# Patient Record
Sex: Female | Born: 1935
Health system: Southern US, Community
[De-identification: ages and names within clinical notes are randomized; demographics above are authoritative.]

## PROBLEM LIST (undated history)

## (undated) DIAGNOSIS — I10 Essential (primary) hypertension: Secondary | ICD-10-CM

## (undated) DIAGNOSIS — I4891 Unspecified atrial fibrillation: Secondary | ICD-10-CM

## (undated) DIAGNOSIS — R002 Palpitations: Secondary | ICD-10-CM

## (undated) DIAGNOSIS — C50919 Malignant neoplasm of unspecified site of unspecified female breast: Secondary | ICD-10-CM

## (undated) DIAGNOSIS — E785 Hyperlipidemia, unspecified: Secondary | ICD-10-CM

## (undated) DIAGNOSIS — I491 Atrial premature depolarization: Secondary | ICD-10-CM

## (undated) DIAGNOSIS — K529 Noninfective gastroenteritis and colitis, unspecified: Secondary | ICD-10-CM

## (undated) DIAGNOSIS — M1990 Unspecified osteoarthritis, unspecified site: Secondary | ICD-10-CM

## (undated) DIAGNOSIS — N6320 Unspecified lump in the left breast, unspecified quadrant: Principal | ICD-10-CM

## (undated) HISTORY — PX: CARDIAC CATHETERIZATION: SHX172

## (undated) HISTORY — DX: Hyperlipidemia, unspecified: E78.5

## (undated) HISTORY — DX: Essential (primary) hypertension: I10

## (undated) HISTORY — PX: TONSILLECTOMY: SUR1361

## (undated) HISTORY — PX: APPENDECTOMY: SHX54

## (undated) HISTORY — DX: Palpitations: R00.2

## (undated) HISTORY — DX: Atrial premature depolarization: I49.1

## (undated) HISTORY — DX: Malignant neoplasm of unspecified site of unspecified female breast: C50.919

## (undated) HISTORY — PX: TUMOR REMOVAL: SHX12

## (undated) HISTORY — PX: COLONOSCOPY: SHX174

## (undated) HISTORY — PX: ABDOMINAL HYSTERECTOMY: SHX81

## (undated) HISTORY — DX: Unspecified lump in the left breast, unspecified quadrant: N63.20

---

## 1997-04-19 HISTORY — PX: MASTECTOMY PARTIAL / LUMPECTOMY: SUR851

## 1997-08-06 ENCOUNTER — Other Ambulatory Visit: Admission: RE | Admit: 1997-08-06 | Discharge: 1997-08-06 | Payer: Self-pay | Admitting: Gastroenterology

## 1998-03-07 ENCOUNTER — Ambulatory Visit (HOSPITAL_COMMUNITY): Admission: RE | Admit: 1998-03-07 | Discharge: 1998-03-07 | Payer: Self-pay

## 1998-03-08 HISTORY — PX: BREAST EXCISIONAL BIOPSY: SUR124

## 1998-03-08 HISTORY — PX: BREAST LUMPECTOMY: SHX2

## 1998-03-12 ENCOUNTER — Encounter: Admission: RE | Admit: 1998-03-12 | Discharge: 1998-06-10 | Payer: Self-pay | Admitting: Radiation Oncology

## 1998-04-01 ENCOUNTER — Ambulatory Visit (HOSPITAL_COMMUNITY): Admission: RE | Admit: 1998-04-01 | Discharge: 1998-04-02 | Payer: Self-pay

## 1998-06-11 ENCOUNTER — Encounter: Admission: RE | Admit: 1998-06-11 | Discharge: 1998-09-09 | Payer: Self-pay | Admitting: Radiation Oncology

## 1998-06-26 ENCOUNTER — Ambulatory Visit (HOSPITAL_COMMUNITY): Admission: RE | Admit: 1998-06-26 | Discharge: 1998-06-26 | Payer: Self-pay | Admitting: General Surgery

## 1998-07-06 ENCOUNTER — Emergency Department (HOSPITAL_COMMUNITY): Admission: EM | Admit: 1998-07-06 | Discharge: 1998-07-06 | Payer: Self-pay | Admitting: Emergency Medicine

## 1999-04-17 ENCOUNTER — Encounter: Payer: Self-pay | Admitting: Oncology

## 1999-04-17 ENCOUNTER — Encounter: Admission: RE | Admit: 1999-04-17 | Discharge: 1999-04-17 | Payer: Self-pay | Admitting: Oncology

## 1999-10-19 ENCOUNTER — Encounter: Admission: RE | Admit: 1999-10-19 | Discharge: 1999-10-19 | Payer: Self-pay

## 2000-03-08 ENCOUNTER — Encounter: Admission: RE | Admit: 2000-03-08 | Discharge: 2000-03-08 | Payer: Self-pay | Admitting: Internal Medicine

## 2000-03-08 ENCOUNTER — Encounter: Payer: Self-pay | Admitting: Internal Medicine

## 2000-04-20 ENCOUNTER — Encounter: Admission: RE | Admit: 2000-04-20 | Discharge: 2000-04-20 | Payer: Self-pay

## 2000-06-22 ENCOUNTER — Ambulatory Visit (HOSPITAL_COMMUNITY): Admission: RE | Admit: 2000-06-22 | Discharge: 2000-06-22 | Payer: Self-pay | Admitting: Gastroenterology

## 2001-02-21 ENCOUNTER — Encounter: Admission: RE | Admit: 2001-02-21 | Discharge: 2001-02-21 | Payer: Self-pay | Admitting: *Deleted

## 2001-04-25 ENCOUNTER — Encounter: Admission: RE | Admit: 2001-04-25 | Discharge: 2001-04-25 | Payer: Self-pay | Admitting: Surgery

## 2001-04-25 ENCOUNTER — Encounter: Payer: Self-pay | Admitting: Surgery

## 2001-11-17 ENCOUNTER — Encounter: Payer: Self-pay | Admitting: Internal Medicine

## 2001-11-17 ENCOUNTER — Encounter: Admission: RE | Admit: 2001-11-17 | Discharge: 2001-11-17 | Payer: Self-pay | Admitting: Internal Medicine

## 2002-04-25 ENCOUNTER — Encounter: Admission: RE | Admit: 2002-04-25 | Discharge: 2002-04-25 | Payer: Self-pay | Admitting: Surgery

## 2002-04-25 ENCOUNTER — Encounter: Payer: Self-pay | Admitting: Surgery

## 2003-04-29 ENCOUNTER — Encounter: Admission: RE | Admit: 2003-04-29 | Discharge: 2003-04-29 | Payer: Self-pay | Admitting: Surgery

## 2004-01-06 ENCOUNTER — Encounter: Admission: RE | Admit: 2004-01-06 | Discharge: 2004-01-06 | Payer: Self-pay | Admitting: Surgery

## 2004-05-04 ENCOUNTER — Encounter: Admission: RE | Admit: 2004-05-04 | Discharge: 2004-05-04 | Payer: Self-pay | Admitting: Surgery

## 2004-05-25 ENCOUNTER — Ambulatory Visit: Payer: Self-pay | Admitting: Internal Medicine

## 2004-06-01 ENCOUNTER — Ambulatory Visit: Payer: Self-pay | Admitting: Internal Medicine

## 2004-10-13 ENCOUNTER — Ambulatory Visit (HOSPITAL_COMMUNITY): Admission: RE | Admit: 2004-10-13 | Discharge: 2004-10-13 | Payer: Self-pay | Admitting: Internal Medicine

## 2004-10-13 ENCOUNTER — Ambulatory Visit: Payer: Self-pay | Admitting: Internal Medicine

## 2005-01-15 ENCOUNTER — Ambulatory Visit: Payer: Self-pay | Admitting: Oncology

## 2005-05-10 ENCOUNTER — Encounter: Admission: RE | Admit: 2005-05-10 | Discharge: 2005-05-10 | Payer: Self-pay | Admitting: Surgery

## 2005-08-18 ENCOUNTER — Encounter: Payer: Self-pay | Admitting: Radiation Oncology

## 2006-01-27 ENCOUNTER — Ambulatory Visit: Payer: Self-pay | Admitting: Oncology

## 2006-05-11 ENCOUNTER — Encounter: Admission: RE | Admit: 2006-05-11 | Discharge: 2006-05-11 | Payer: Self-pay | Admitting: Surgery

## 2007-01-27 ENCOUNTER — Ambulatory Visit: Payer: Self-pay | Admitting: Oncology

## 2007-01-31 LAB — CBC WITH DIFFERENTIAL/PLATELET
BASO%: 0.7 % (ref 0.0–2.0)
EOS%: 3 % (ref 0.0–7.0)
LYMPH%: 31.5 % (ref 14.0–48.0)
MCHC: 35.4 g/dL (ref 32.0–36.0)
MCV: 89.3 fL (ref 81.0–101.0)
MONO%: 10.4 % (ref 0.0–13.0)
Platelets: 180 10*3/uL (ref 145–400)
RBC: 4.18 10*6/uL (ref 3.70–5.32)
WBC: 5.8 10*3/uL (ref 3.9–10.0)

## 2007-01-31 LAB — COMPREHENSIVE METABOLIC PANEL
ALT: 10 U/L (ref 0–35)
Alkaline Phosphatase: 60 U/L (ref 39–117)
Sodium: 140 mEq/L (ref 135–145)
Total Bilirubin: 0.5 mg/dL (ref 0.3–1.2)
Total Protein: 6 g/dL (ref 6.0–8.3)

## 2007-05-15 ENCOUNTER — Encounter: Admission: RE | Admit: 2007-05-15 | Discharge: 2007-05-15 | Payer: Self-pay | Admitting: Internal Medicine

## 2008-05-27 ENCOUNTER — Encounter: Admission: RE | Admit: 2008-05-27 | Discharge: 2008-05-27 | Payer: Self-pay | Admitting: Internal Medicine

## 2009-05-27 ENCOUNTER — Encounter: Admission: RE | Admit: 2009-05-27 | Discharge: 2009-05-27 | Payer: Self-pay | Admitting: Internal Medicine

## 2009-10-01 ENCOUNTER — Ambulatory Visit: Payer: Self-pay | Admitting: Vascular Surgery

## 2010-03-09 ENCOUNTER — Ambulatory Visit: Payer: Self-pay | Admitting: Cardiology

## 2010-06-10 ENCOUNTER — Other Ambulatory Visit: Payer: Self-pay | Admitting: Internal Medicine

## 2010-06-10 DIAGNOSIS — Z1231 Encounter for screening mammogram for malignant neoplasm of breast: Secondary | ICD-10-CM

## 2010-07-02 ENCOUNTER — Ambulatory Visit
Admission: RE | Admit: 2010-07-02 | Discharge: 2010-07-02 | Disposition: A | Discharge status: Home / Self Care | Payer: Medicare Other | Source: Ambulatory Visit | Attending: Internal Medicine | Admitting: Internal Medicine

## 2010-07-02 DIAGNOSIS — Z1231 Encounter for screening mammogram for malignant neoplasm of breast: Secondary | ICD-10-CM

## 2010-07-14 ENCOUNTER — Encounter: Payer: Self-pay | Admitting: Internal Medicine

## 2010-08-18 ENCOUNTER — Other Ambulatory Visit: Payer: Self-pay | Admitting: Cardiology

## 2010-08-18 ENCOUNTER — Other Ambulatory Visit: Payer: Self-pay | Admitting: *Deleted

## 2010-08-18 NOTE — Telephone Encounter (Signed)
Medication refill

## 2010-09-01 NOTE — Consult Note (Signed)
NEW PATIENT CONSULTATION   Cassandra Lang, Cassandra Lang  DOB:  Aug 08, 1924                                       10/01/2009  GNFAO#:[id number]   The patient presents today for evaluation of lower extremity swelling  and pain.  She is an active 77 year old white female who reports  progressive pain, swelling and stinging sensation in her legs  bilaterally.  This is most pronounced in her mid calves distally.  She  reports this has been progressive over the past several months.  She  does not have any specific relief with elevation or compression although  she does wear support hose occasionally if she is going to be standing  for a great deal of time.  She reports this does make it somewhat worse.   Her past history is negative for DVT.  She does have history of  hypertension, elevated cholesterol.   Her family history is significant for brother dying of heart valve  disease at age 38.   SOCIAL HISTORY:  She is married with one child.  She is retired.  She  does not smoke or drink alcohol.   REVIEW OF SYSTEMS:  Review of systems is noted in her chart.  She has no  weight loss or weight gain.  She is 156 pounds.  She is 5 feet 3 inches  tall.  She does have cardiac palpitations.  PULMONARY:  Negative.  GI:  Occasional diarrhea.  GU:  Negative.  VASCULAR:  She was noted to have phlebitis around the time of a ruptured  appendix since 1948 but no problems since that time.  NEUROLOGIC:  Negative.  MUSCULOSKELETAL:  Positive for arthritic and back pain.  Psychiatric, ENT, hematologic, skin are all negative.   PHYSICAL EXAMINATION:  General:  Well-developed, well-nourished white  female appearing stated age.  Vital signs:  Blood pressure is 148/60,  pulse 64, respirations 16.  She is in no acute stress.  HEENT:  Normal.  Abdomen:  Her abdomen is soft and nontender.  She does have 2+ radial,  2+ posterior tibial pulses and 2+ popliteal pulses bilaterally.  Musculoskeletal:   Negative for major deformities or cyanosis.  Neurological:  No focal weakness or paresthesias.  Skin:  She does have  pitting edema on both lower extremities from her below knee down towards  her ankle.  She has erythema over her pretibial area towards her ankle  bilaterally.   I ordered, reviewed and interpreted noninvasive lower extremity studies  on the patient that demonstrate no evidence of DVT or superficial  thrombus.  She has no evidence of reflux in her deep or superficial  system.  I discussed this at length with the patient.  I explained that  although her physical exam is somewhat characteristic of venous  hypertension her Doppler study shows that this is not the case.  She  does have normal arterial flow.  She was reassured with this discussion  and will follow up with Dr. Clelia Croft and Dr. Terri Piedra regarding the swelling  and erythema.     Larina Earthly, M.D.  Electronically Signed   TFE/MEDQ  D:  10/01/2009  T:  10/02/2009  Job:  [id number]   cc:   Kari Baars, M.D.  [name]. [place], M.D.  Frederick A. Worthy Rancher, M.D.

## 2010-09-01 NOTE — Procedures (Signed)
DUPLEX DEEP VENOUS EXAM - LOWER EXTREMITY   INDICATION:  Pain and swelling.   HISTORY:  Edema:  Yes.  Trauma/Surgery:  No.  Pain:  Yes.  PE:  No.  Previous DVT:  No.  Anticoagulants:  No.  Other:   DUPLEX EXAM:                CFV   SFV   PopV  PTV    GSV                R  L  R  L  R  L  R   L  R  L  Thrombosis    0  0  0  0  0  0  0   0  0  0  Spontaneous   +  +  +  +  +  +  +   +  +  +  Phasic        +  +  +  +  +  +  +   +  +  +  Augmentation  +  +  +  +  +  +  +   +  +  +  Compressible  +  +  +  +  +  +  +   +  +  +  Competent     +  +  +  +  +  +  +   +  +  +   Legend:  + - yes  o - no  p - partial  D - decreased   IMPRESSION:  Bilateral legs are negative for deep venous thrombosis  without evidence of deep venous reflux or  superficial venous reflux.    _____________________________  Larina Earthly, M.D.   NT/MEDQ  D:  10/01/2009  T:  10/01/2009  Job:  [id number]

## 2010-09-04 NOTE — Procedures (Signed)
[facility]  Patient:    Cassandra Lang, FERNICOLA                          MRN: [id number] Proc. Date: 06/22/00 Adm. Date:  [id number] Attending:  Louie Bun CC:         Jenel Lucks, M.D.   Procedure Report  PROCEDURE:  Colonoscopy.  INDICATION FOR PROCEDURE:  History of adenomatous colon polyps on previous colonoscopy three years ago.  DESCRIPTION OF PROCEDURE:  The patient was placed in the left lateral decubitus position and placed on the pulse monitor with continuous low-flow oxygen delivered by nasal cannula.  She was sedated with 50 mg IV Demerol and 5 mg IV Versed.  The Olympus video colonoscope was inserted into the rectum and advanced to the cecum, confirmed by transillumination at McBurneys point and visualization of the ileocecal valve and appendiceal orifice.  Prep was excellent.  The cecum, ascending, transverse, and descending colon all appeared normal with no masses, polyps, diverticula, or other mucosal abnormalities.  The sigmoid colon revealed a few scattered diverticula and was otherwise unremarkable.  The rectum appeared normal on retroflex view.  The anus revealed no obvious internal hemorrhoids.  The colonoscope was then withdrawn and the patient returned to the recovery room in stable condition. She tolerated the procedure well, and there were no immediate complications.  IMPRESSION:  Sigmoid diverticulosis, otherwise normal colonoscopy.  PLAN:  Repeat colonoscopy in five years. DD:  06/22/00 TD:  06/22/00 Job: [id number]

## 2010-09-07 ENCOUNTER — Encounter: Payer: Self-pay | Admitting: Cardiology

## 2010-09-07 DIAGNOSIS — I10 Essential (primary) hypertension: Secondary | ICD-10-CM | POA: Insufficient documentation

## 2010-09-07 DIAGNOSIS — E785 Hyperlipidemia, unspecified: Secondary | ICD-10-CM | POA: Insufficient documentation

## 2010-09-07 DIAGNOSIS — R002 Palpitations: Secondary | ICD-10-CM | POA: Insufficient documentation

## 2010-09-07 DIAGNOSIS — C50919 Malignant neoplasm of unspecified site of unspecified female breast: Secondary | ICD-10-CM | POA: Insufficient documentation

## 2010-09-09 ENCOUNTER — Ambulatory Visit (INDEPENDENT_AMBULATORY_CARE_PROVIDER_SITE_OTHER): Payer: Medicare Other | Admitting: Cardiology

## 2010-09-09 ENCOUNTER — Encounter: Payer: Self-pay | Admitting: Cardiology

## 2010-09-09 VITALS — BP 142/66 | HR 67 | Ht 63.0 in | Wt 160.4 lb

## 2010-09-09 DIAGNOSIS — R002 Palpitations: Secondary | ICD-10-CM

## 2010-09-09 DIAGNOSIS — E785 Hyperlipidemia, unspecified: Secondary | ICD-10-CM

## 2010-09-09 DIAGNOSIS — I10 Essential (primary) hypertension: Secondary | ICD-10-CM

## 2010-09-09 NOTE — Assessment & Plan Note (Signed)
Blood pressure is well controlled on current medications. 

## 2010-09-09 NOTE — Assessment & Plan Note (Signed)
Lipids are under excellent control. She will continue with her current therapy.

## 2010-09-09 NOTE — Patient Instructions (Signed)
We will have you wear a monitor for 24 hours to look at your heart rhythm  Cut out caffeine.  Continue your current medications.  We will call with the results of your monitor.

## 2010-09-09 NOTE — Assessment & Plan Note (Signed)
Her symptoms are most consistent with PACs or PVCs. She has no history of coronary disease and had a normal nuclear stress test in 2006. Her ECG is normal. I have recommended that she restrict her caffeine intake. She is already on a beta blocker. We will have her wear a 24-hour Holter monitor to quantify her ectopy.

## 2010-09-09 NOTE — Progress Notes (Signed)
   Cassandra Lang Date of Birth: [birth year]   History of Present Illness: Cassandra Lang is seen for followup today. She reports no the past week she has experienced increased palpitations. She describes this as a skipped beat or irregular beat. It does not race. It does make her feel a little "panicky" she has had no shortness of breath, chest pain, or dizziness. She does drink a modest amount of caffeine. She states that it makes it difficult for her to sleep.  Current Outpatient Prescriptions on File Prior to Visit  Medication Sig Dispense Refill  . acetaminophen (TYLENOL) 325 MG tablet Take 650 mg by mouth every 6 (six) hours as needed.        Marland Kitchen aspirin 81 MG tablet Take 81 mg by mouth daily.        Marland Kitchen atorvastatin (LIPITOR) 10 MG tablet Take 10 mg by mouth daily.        . Calcium Carbonate-Vitamin D (CALCIUM + D PO) Take by mouth 2 (two) times daily.        . DiphenhydrAMINE HCl (BENADRYL ALLERGY PO) Take by mouth as needed.        . hydrochlorothiazide 25 MG tablet TAKE 1 TABLET EVERY DAY  30 tablet  5  . Loratadine (CLARITIN PO) Take by mouth as needed.        . metoprolol tartrate (LOPRESSOR) 25 MG tablet Take 25 mg by mouth 2 (two) times daily.        . valsartan (DIOVAN) 80 MG tablet Take 80 mg by mouth daily.        Marland Kitchen DISCONTD: Cholecalciferol (VITAMIN D) 1000 UNITS capsule Take 1,000 Units by mouth daily.        Marland Kitchen DISCONTD: vitamin E 400 UNIT capsule Take 400 Units by mouth daily.          Allergies  Allergen Reactions  . Penicillins     Past Medical History  Diagnosis Date  . Hypertension   . Hyperlipidemia   . Breast cancer   . Palpitations     Past Surgical History  Procedure Date  . Tumor removal     FROM FALLOPIAN TUBE  . Mastectomy partial / lumpectomy   . Abdominal hysterectomy     History  Smoking status  . Never Smoker   Smokeless tobacco  . Not on file    History  Alcohol Use No    Family History  Problem Relation Age of Onset  . Kidney disease  Father     Review of Systems:   All other systems were reviewed and are negative.  Physical Exam: BP 142/66  Pulse 67  Ht 5\' 3"  (1.6 m)  Wt 160 lb 6.4 oz (72.757 kg)  BMI 28.41 kg/m2 She is a pleasant elderly female in no distress. Her HEENT exam is unremarkable. She has no JVD or bruits. Lungs are clear. Cardiac exam reveals a regular rate and rhythm without gallop, murmur, or click. Abdomen is soft and nontender. She has no masses or bruits. Pulses are good throughout. She has no edema. Neurologic exam is nonfocal. LABORATORY DATA: ECG today is normal. Blood work dated July 14, 2010 including complete chemistry panel, CBC, and TSH were normal. Cholesterol was 169, triglycerides 171, HDL 54, LDL 81, apolipoprotein B. Is 76.  Assessment / Plan:

## 2010-09-16 ENCOUNTER — Telehealth: Payer: Self-pay | Admitting: *Deleted

## 2010-09-16 NOTE — Telephone Encounter (Signed)
Notified of 24 hr monitor results. Will fax to Dr. Clelia Croft

## 2010-09-18 ENCOUNTER — Other Ambulatory Visit: Payer: Self-pay | Admitting: Cardiology

## 2010-09-18 NOTE — Telephone Encounter (Signed)
escribe medication per fax request  

## 2010-09-19 ENCOUNTER — Inpatient Hospital Stay (HOSPITAL_COMMUNITY)
Admission: EM | Admit: 2010-09-19 | Discharge: 2010-09-22 | DRG: 379 | Disposition: A | Discharge status: Home / Self Care | Payer: Medicare Other | Attending: Internal Medicine | Admitting: Internal Medicine

## 2010-09-19 DIAGNOSIS — I2510 Atherosclerotic heart disease of native coronary artery without angina pectoris: Secondary | ICD-10-CM | POA: Diagnosis present

## 2010-09-19 DIAGNOSIS — Z923 Personal history of irradiation: Secondary | ICD-10-CM

## 2010-09-19 DIAGNOSIS — Z880 Allergy status to penicillin: Secondary | ICD-10-CM

## 2010-09-19 DIAGNOSIS — I10 Essential (primary) hypertension: Secondary | ICD-10-CM | POA: Diagnosis present

## 2010-09-19 DIAGNOSIS — Z853 Personal history of malignant neoplasm of breast: Secondary | ICD-10-CM

## 2010-09-19 DIAGNOSIS — K5731 Diverticulosis of large intestine without perforation or abscess with bleeding: Principal | ICD-10-CM | POA: Diagnosis present

## 2010-09-19 DIAGNOSIS — Z8544 Personal history of malignant neoplasm of other female genital organs: Secondary | ICD-10-CM

## 2010-09-19 DIAGNOSIS — Z860100 Personal history of colon polyps, unspecified: Secondary | ICD-10-CM

## 2010-09-19 DIAGNOSIS — K5289 Other specified noninfective gastroenteritis and colitis: Secondary | ICD-10-CM | POA: Diagnosis present

## 2010-09-19 DIAGNOSIS — E785 Hyperlipidemia, unspecified: Secondary | ICD-10-CM | POA: Diagnosis present

## 2010-09-19 DIAGNOSIS — M899 Disorder of bone, unspecified: Secondary | ICD-10-CM | POA: Diagnosis present

## 2010-09-19 DIAGNOSIS — E7800 Pure hypercholesterolemia, unspecified: Secondary | ICD-10-CM | POA: Diagnosis present

## 2010-09-19 DIAGNOSIS — Z9221 Personal history of antineoplastic chemotherapy: Secondary | ICD-10-CM

## 2010-09-20 LAB — PROTIME-INR: Prothrombin Time: 13 seconds (ref 11.6–15.2)

## 2010-09-20 LAB — DIFFERENTIAL
Basophils Absolute: 0 10*3/uL (ref 0.0–0.1)
Eosinophils Absolute: 0.1 10*3/uL (ref 0.0–0.7)
Lymphocytes Relative: 20 % (ref 12–46)

## 2010-09-20 LAB — HEMOGLOBIN AND HEMATOCRIT, BLOOD: Hemoglobin: 13.2 g/dL (ref 12.0–15.0)

## 2010-09-20 LAB — TYPE AND SCREEN: Antibody Screen: NEGATIVE

## 2010-09-20 LAB — BASIC METABOLIC PANEL
CO2: 27 mEq/L (ref 19–32)
Chloride: 100 mEq/L (ref 96–112)
Creatinine, Ser: 1.01 mg/dL (ref 0.4–1.2)
GFR calc Af Amer: >60 mL/min (ref >60)
GFR calc non Af Amer: 52 mL/min — ABNORMAL LOW (ref >60)
Potassium: 4.1 mEq/L (ref 3.5–5.1)

## 2010-09-20 LAB — CBC
HCT: 41.5 % (ref 36.0–46.0)
Hemoglobin: 14.5 g/dL (ref 12.0–15.0)
MCH: 30.9 pg (ref 26.0–34.0)
MCHC: 34.9 g/dL (ref 30.0–36.0)
MCV: 88.5 fL (ref 78.0–100.0)
Platelets: 165 10*3/uL (ref 150–400)
WBC: 10.7 10*3/uL — ABNORMAL HIGH (ref 4.0–10.5)

## 2010-09-20 LAB — APTT: aPTT: 27 seconds (ref 24–37)

## 2010-09-20 NOTE — H&P (Signed)
[name], Cassandra Lang                   ACCOUNT NO.:  [id number]  MEDICAL RECORD NO.:  [id number]           PATIENT TYPE:  I  LOCATION:  5526                         FACILITY:  [facility]  PHYSICIAN:  Kari Baars, M.D.  DATE OF BIRTH:  12-Nov-1924  DATE OF ADMISSION:  09/19/2010 DATE OF DISCHARGE:                             HISTORY & PHYSICAL   CHIEF COMPLAINT:  Blood in the stool.  HISTORY OF PRESENT ILLNESS:  [name] is an 77 year old white female with a history of hypertension, hyperlipidemia, and diverticulosis who presented to the emergency department with a complaint of blood in her stool.  The patient states that she was in her usual state of health until yesterday morning when she developed fairly severe abdominal cramping followed by nausea, vomiting, and diarrhea.  After several episodes of nausea and vomiting and loose stools, she noticed some blood in her stools.  She subsequently had multiple maroon colored stools prompting her to come to the emergency department where she has continued to have some maroon stool/clotting output.  She continued to have some lower abdominal cramping, although the abdominal pain is better than earlier in the day.  She denies any fevers, chills, or sweats.  She has not had any sick contacts.  She does have a history of diverticulosis on her last colonoscopy on June 2007 and has a remote history of colon polyps, but has never had a history of GI bleed.  REVIEW OF SYSTEMS:  All systems reviewed with the patient and are negative except in the HPI with following exceptions:  She has had some recent palpitations which were improved with a decreasing caffeine intake.  PAST MEDICAL HISTORY: 1. Hypertension. 2. Hyperlipidemia. 3. Osteopenia. 4. History of fallopian tube cancer (1991) status post total abdominal     hysterectomy/bilateral salpingo-oophorectomy followed by     chemotherapy. 5. History of right breast cancer (1999) status post  lumpectomy,     radiation, and 5 years of tamoxifen therapy. 6. History of colon polyps on a remote colonoscopy.  Colonoscopies     every 5 years, most recently in June 2007 by Dr. Dorena Cookey with     left-sided diverticulosis per patient report. 7. Status post appendectomy. 8. Status post hemorrhoidectomy.  CURRENT MEDICATIONS: 1. Diovan 80 mg daily. 2. Hydrochlorothiazide 25 mg daily. 3. Lipitor 10 mg at bedtime. 4. Metoprolol 25 mg b.i.d. 5. Aspirin 81 mg daily. 6. Calcium/vitamin D.  ALLERGIES:  PENICILLIN.  SOCIAL HISTORY:  She is married and has 1 daughter.  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  No tobacco, rare alcohol, and no drug use.  FAMILY HISTORY:  Father died at the age of 66 and had nephrolithiasis. Mother died of COPD at 96.  She has 3 brothers who had valvular disease, lung cancer, and prostate cancer.  She had a sister with breast cancer and 1 sister that is still alive and healthy.  PHYSICAL EXAM:  VITAL SIGNS:  Temperature 97.5, blood pressure 130/51, pulse 65, respirations 18, oxygen saturation 97% on room air. GENERAL:  Pleasant white female in no acute distress. HEENT: No scleral icterus.  Oropharynx is moist. NECK:  Supple without lymphadenopathy or JVD. HEART:  Regular rate and rhythm without murmurs, rubs, or gallops. LUNGS:  Clear to auscultation bilaterally. ABDOMEN:  Slightly distended with hyperactive bowel sounds.  She does have some suprapubic tenderness but no rebound or guarding. EXTREMITIES:  Trace edema with no clubbing or cyanosis.  LABORATORY DATA:  CBC shows a white count of 10.7, hemoglobin 14.5, platelets 165.  BMET significant for sodium 138, potassium 4.1, chloride 100, bicarb 27, BUN 24, creatinine 1.0, glucose 97, INR 0.96.  ASSESSMENT/PLAN: 1. Lower GI bleed - this is most likely related to diverticulosis.     Less likely mesenteric ischemia, although she did have abdominal     pain preceding her hematochezia.  We will obtain a  lactic acid and     admit for serial hemoglobins and hematocrits and gentle IV fluid     hydration.  We will transfuse if her hemoglobin drops below 9 or 10     or if she becomes hypotensive.  She is currently hemodynamically     stable, so we will simply monitor her stool output at this time.     Consider bleeding scan if she has persistent bleeding.  I will     consult GI (Dr. Madilyn Fireman with the [facility]) to consider colonoscopy     when she is stable as she is already due in June 2012 anyway and it     would be helpful to confirm the source of her hematochezia.  We     will hold her aspirin. 2. Hypertension - we will hold her antihypertensive therapy initially     to prevent hypotension.  We will restart slowly if she remains     stable. 3. Hyperlipidemia - continue Lipitor. 4. DVT prophylaxis - SCDs for DVT prophylaxis as anticoagulants are     contraindicated in the setting of her bleeding. 5. Disposition - anticipate she will be discharged home when she is     stable.     Kari Baars, M.D.     WS/MEDQ  D:  09/20/2010  T:  09/20/2010  Job:  [id number]  cc:   Everardo All. Madilyn Fireman, M.D.  Electronically Signed by Lacretia Nicks. Buren Kos M.D. on 09/20/2010 04:15:38 PM

## 2010-09-21 LAB — BASIC METABOLIC PANEL
CO2: 27 mEq/L (ref 19–32)
Chloride: 106 mEq/L (ref 96–112)
Creatinine, Ser: 0.73 mg/dL (ref 0.4–1.2)
GFR calc Af Amer: >60 mL/min (ref >60)
Glucose, Bld: 96 mg/dL (ref 70–99)

## 2010-09-21 LAB — CBC
HCT: 37.2 % (ref 36.0–46.0)
Hemoglobin: 12.7 g/dL (ref 12.0–15.0)
MCH: 30.2 pg (ref 26.0–34.0)
MCV: 88.4 fL (ref 78.0–100.0)
RBC: 4.21 MIL/uL (ref 3.87–5.11)

## 2010-09-22 ENCOUNTER — Other Ambulatory Visit: Payer: Self-pay | Admitting: Gastroenterology

## 2010-09-22 LAB — CBC
MCH: 30.3 pg (ref 26.0–34.0)
MCV: 89.1 fL (ref 78.0–100.0)
Platelets: 152 10*3/uL (ref 150–400)
RBC: 4.02 MIL/uL (ref 3.87–5.11)

## 2010-09-22 LAB — BASIC METABOLIC PANEL
BUN: 8 mg/dL (ref 6–23)
CO2: 24 mEq/L (ref 19–32)
Chloride: 111 mEq/L (ref 96–112)
Creatinine, Ser: 0.75 mg/dL (ref 0.4–1.2)

## 2010-10-31 NOTE — Consult Note (Signed)
NAMEGWENDOLEN, Cassandra Lang                   ACCOUNT NO.:  [id number]  MEDICAL RECORD NO.:  [id number]           PATIENT TYPE:  I  LOCATION:  5526                         FACILITY:  [facility]  PHYSICIAN:  Petra Kuba, M.D.    DATE OF BIRTH:  Aug 04, 1924  DATE OF CONSULTATION:  09/20/2010 DATE OF DISCHARGE:                                CONSULTATION   HISTORY:  The patient was seen at the request of Dr. Clelia Croft for some abdominal pain mostly lower, little bit of nausea and some blood mixed in her stool with some clots.  She has not had any previous bleeding and this started on Saturday.  She has had at least three colonoscopies by my partner Dr. Madilyn Fireman.  First one with colon polyps and two negative ones in 2002 and 2007 with just some sigmoid diverticula.  She is on an aspirin a day, but no other obvious blood thinners, and she does say she is a little better today with significantly less bleeding and has been able to keep food down.  PAST MEDICAL HISTORY:  Pertinent for the polyps and diverticula as above as well as hypertension, increased cholesterol, osteopenia, fallopian tube cancer in 1991 with total hysterectomy and SBO.  She also had a right breast cancer and lumpectomy, radiation, and tamoxifen, as well as an appendectomy and hemorrhoidectomy.  MEDICINES AT HOME:  Include, Diovan, hydrochlorothiazide, Lipitor, Lopressor, aspirin, and calcium.  ALLERGIES:  PENICILLIN.  SOCIAL HISTORY:  Does not smoke, rarely drinks.  FAMILY HISTORY:  Pertinent for a sister with breast cancer, but no significant GI problems in the family.  PHYSICAL EXAM:  GENERAL:  No acute distress, sitting comfortably in the chair. VITAL SIGNS:  Stable, afebrile. ABDOMEN:  Slightly distended, but nontender.  Adequate bowel sounds.  LABS:  Pertinent for a hemoglobin of 13.2 today, on admission was 14.5, BUN of 24, creatinine 1.01.  PT was normal.  ASSESSMENT:  Diverticular bleeding, subacute versus ischemic  colitis with her abdominal pain and favor the latter in a patient with a history of colon polyps with breast and fallopian tube cancers in the past.  PLAN:  We will go ahead and change her back to clear liquids just in case we want to proceed with a colonoscopy, Tuesday.  I discussed with proceeding with a colonoscopy tomorrow or Tuesday, but she would like to hold off until the pain is little better and I think that is reasonable. My partner Dr. Madilyn Fireman who knows her should be available to see her tomorrow and then they can decide how to proceed.  Certainly if better, we could advance her diet and recheck her colon as an outpatient, although ischemic colitis would most likely healed by then if we wanted to try to document that what she had.  Thank you very much for the consult.  We will follow with you.          ______________________________ Petra Kuba, M.D.     MEM/MEDQ  D:  09/20/2010  T:  09/21/2010  Job:  [id number]  cc:   Everardo All. Madilyn Fireman, M.D.  Electronically Signed  by Vida Rigger M.D. on 10/31/2010 03:01:17 PM

## 2010-11-04 NOTE — Op Note (Signed)
  NAMEOREAN, GIARRATANO                   ACCOUNT NO.:  [id number]  MEDICAL RECORD NO.:  [id number]  LOCATION:  5526                         FACILITY:  [facility]  PHYSICIAN:  Nazaire Cordial C. Madilyn Fireman, M.D.    DATE OF BIRTH:  02-18-1925  DATE OF PROCEDURE:  09/22/2010 DATE OF DISCHARGE:  09/22/2010                              OPERATIVE REPORT   SURGEON:  Sashay Felling C. Madilyn Fireman, MD  PROCEDURE:  Colonoscopy with biopsy.  INDICATIONS FOR PROCEDURE:  Low-grade rectal bleeding in a patient with last colonoscopy 5 years ago.  PROCEDURE:  The patient was placed in the left lateral decubitus position and placed on the pulse monitor with continuous low-flow oxygen delivered by nasal cannula.  She was sedated with 50 mcg of IV fentanyl and 4 mg of IV Versed.  The Olympus video colonoscope was inserted into the rectum and advanced to the cecum, confirmed by transillumination of McBurney's point, visualization of the ileocecal valve and appendiceal orifice.  The prep was good.  The cecum, ascending, and transverse colon appeared normal with no masses, polyps, diverticuli, or other mucosal abnormalities.  There was a few diverticuli in the descending colon, which were more numerous in the sigmoid and rectosigmoid.  Also in the sigmoid and rectosigmoid, there was patchy erythema, granularity, and friability mainly overlying the haustral folds.  The appearance was that of a nonspecific probably resolving colitis with features not endoscopically discriminating between infectious, inflammatory, and ischemic etiologies.  Biopsies were taken.  The rectum from about 20 cm down appeared normal with no further inflammation.  The scope was then withdrawn and the patient returned to the recovery room in stable condition.  She tolerated the procedure well.  There were no immediate complications.  IMPRESSION: 1. Mild nonspecific-appearing colitis. 2. Left-sided diverticulosis.  PLAN:  Expectant management alone, await biopsy  results, and will follow up as an outpatient.  Her Scholl duct Jabier Mutton          ______________________________ Everardo All Madilyn Fireman, M.D.    JCH/MEDQ  D:  09/22/2010  T:  09/22/2010  Job:  [id number]  cc:   Kari Baars, M.D.  Electronically Signed by Dorena Cookey M.D. on 11/04/2010 06:56:13 PM

## 2010-12-29 NOTE — Discharge Summary (Signed)
NAMEPARALEE, Cassandra Lang NO.:  [id number]  MEDICAL RECORD NO.:  [id number]  LOCATION:  5526                         FACILITY:  [facility]  PHYSICIAN:  Kari Baars, M.D.  DATE OF BIRTH:  08/11/1924  DATE OF ADMISSION:  09/19/2010 DATE OF DISCHARGE:  09/22/2010                              DISCHARGE SUMMARY   DISCHARGE DIAGNOSES: 1. Lower gastrointestinal bleed secondary to colitis, possibly     ischemic. 2. Abdominal pain secondary to #1, resolved. 3. Hypertension. 4. Hyperlipidemia. 5. Osteopenia. 6. History of fallopian tube cancer status post total abdominal     hysterectomy/bilateral salpingo-oophorectomy followed by     chemotherapy (1991). 7. History of right breast cancer (1999) status post lumpectomy,     radiation, and 5 years of tamoxifen therapy. 8. History of colon polyps on a remote colonoscopy with followup     colonoscopies every 5 years most recently in June 2007, with no     findings other than diverticulosis. 9. Status post appendectomy.  Status post total hemorrhoidectomy.  DISCHARGE MEDICATIONS: 1. Atorvastatin 10 mg at bedtime. 2. Calcium/vitamin D daily. 3. Diovan 80 mg daily. 4. HCTZ 25 mg daily to resume in 1-2 days when she is tolerating     regular diet. 5. She was instructed to hold her aspirin 81 mg daily for at least 2     weeks.  HOSPITAL PROCEDURES:  Colonoscopy (June 2005 by Dr. Dorena Cookey) - mild nonspecific appearing colitis.  Left-sided diverticulosis.  HISTORY OF PRESENT ILLNESS:  For full details, please see dictated history and physical.  Briefly, Cassandra Lang is a pleasant 77 year old white female with history of hypertension, hyperlipidemia, and diverticulosis who presented to the emergency department with a complaint of blood in her stool.  The patient states that she was in her usual state of health when she developed fairly severe acute onset abdominal cramping followed by nausea, vomiting, and diarrhea.  After  several episodes of nausea and vomiting and loose stools, she noticed maroon colored blood in her stools.  She continued to have maroon-colored stools prompting her to come to the emergency department where her abdominal pain has improved. Vital signs were stable with a temperature of 97.5, blood pressure 130/51, pulse 65.  Exam showed mild suprapubic tenderness, but no rebound and no guarding.  White blood count was 10.7, hemoglobin 14.5, platelets 165.  BUN and creatinine were 24 and 1.0.  Given her bloody stools, she was admitted for further management.  HOSPITAL COURSE:  The patient was admitted to a medical bed.  She underwent serial CBCs to make sure she had no significant blood loss. Her hemoglobin trended down to 12.2, but remained relatively stable throughout this hospitalization.  Her bloody stools slowly resolved with no bloody stools within 48 hours of discharge.  Gastroenterology was consulted and Dr. Madilyn Fireman recommended a colonoscopy which was performed on September 22, 2010, with findings consistent with nonspecific-appearing colitis and left-sided diverticulosis.  Biopsies were obtained. Findings may have been consistent with ischemic colitis.  The patient remained hemodynamically stable throughout her hospitalization.  Her antihypertensive therapy was initially held, but was slowly restarted with diet prior to discharge.  Her blood pressure remained stable with this.  Her aspirin was held throughout this hospitalization.  Given stability of her hemoglobin and resolution of her GI bleeding, the patient was deemed stable for discharge home after her colonoscopy with close outpatient followup.  DISCHARGE LABS:  CBC on the day of discharge shows a white count of 6.3, hemoglobin 12.2, platelets 152.  BMET showed sodium 143, potassium 3.5, chloride 111, bicarb 24, BUN 8, creatinine 0.75, glucose 96.  Lactic acid on admission was 1.8.  INR was 0.96.  DISCHARGE INSTRUCTIONS:  The  patient was instructed to call if she had any increased abdominal pain or blood in her stools.  DISCHARGE DIET:  Full liquid diet to advance as tolerated.  HOSPITAL FOLLOWUP:  She will follow up with Dr. Clelia Croft at [facility] within 2 weeks.  She should follow up with Dr. Madilyn Fireman in 3-4 weeks at [facility].  CONDITION ON DISCHARGE:  Improved.  Time of discharge activity is greater than 30 minutes.     Kari Baars, M.D.     WS/MEDQ  D:  12/02/2010  T:  12/03/2010  Job:  [id number]  cc:   Everardo All. Madilyn Fireman, M.D.  Electronically Signed by Lacretia Nicks. Buren Kos M.D. on 12/29/2010 09:40:53 PM

## 2011-02-21 ENCOUNTER — Other Ambulatory Visit: Payer: Self-pay | Admitting: Cardiology

## 2011-03-22 ENCOUNTER — Other Ambulatory Visit: Payer: Self-pay | Admitting: Cardiology

## 2011-05-03 DIAGNOSIS — R197 Diarrhea, unspecified: Secondary | ICD-10-CM | POA: Diagnosis not present

## 2011-05-03 DIAGNOSIS — R05 Cough: Secondary | ICD-10-CM | POA: Diagnosis not present

## 2011-05-03 DIAGNOSIS — I10 Essential (primary) hypertension: Secondary | ICD-10-CM | POA: Diagnosis not present

## 2011-05-03 DIAGNOSIS — J159 Unspecified bacterial pneumonia: Secondary | ICD-10-CM | POA: Diagnosis not present

## 2011-05-25 ENCOUNTER — Other Ambulatory Visit: Payer: Self-pay | Admitting: Internal Medicine

## 2011-05-25 DIAGNOSIS — Z1231 Encounter for screening mammogram for malignant neoplasm of breast: Secondary | ICD-10-CM

## 2011-07-06 ENCOUNTER — Ambulatory Visit
Admission: RE | Admit: 2011-07-06 | Discharge: 2011-07-06 | Disposition: A | Discharge status: Home / Self Care | Payer: Medicare Other | Source: Ambulatory Visit | Attending: Internal Medicine | Admitting: Internal Medicine

## 2011-07-06 DIAGNOSIS — Z1231 Encounter for screening mammogram for malignant neoplasm of breast: Secondary | ICD-10-CM | POA: Diagnosis not present

## 2011-08-04 DIAGNOSIS — M949 Disorder of cartilage, unspecified: Secondary | ICD-10-CM | POA: Diagnosis not present

## 2011-08-04 DIAGNOSIS — M899 Disorder of bone, unspecified: Secondary | ICD-10-CM | POA: Diagnosis not present

## 2011-08-04 DIAGNOSIS — I10 Essential (primary) hypertension: Secondary | ICD-10-CM | POA: Diagnosis not present

## 2011-08-04 DIAGNOSIS — E785 Hyperlipidemia, unspecified: Secondary | ICD-10-CM | POA: Diagnosis not present

## 2011-08-11 DIAGNOSIS — C5700 Malignant neoplasm of unspecified fallopian tube: Secondary | ICD-10-CM | POA: Diagnosis not present

## 2011-08-11 DIAGNOSIS — Z1212 Encounter for screening for malignant neoplasm of rectum: Secondary | ICD-10-CM | POA: Diagnosis not present

## 2011-08-11 DIAGNOSIS — I10 Essential (primary) hypertension: Secondary | ICD-10-CM | POA: Diagnosis not present

## 2011-08-11 DIAGNOSIS — Z0000 Encounter for general adult medical examination without abnormal findings: Secondary | ICD-10-CM | POA: Diagnosis not present

## 2011-08-11 DIAGNOSIS — E785 Hyperlipidemia, unspecified: Secondary | ICD-10-CM | POA: Diagnosis not present

## 2011-08-19 ENCOUNTER — Other Ambulatory Visit: Payer: Self-pay

## 2011-08-19 MED ORDER — HYDROCHLOROTHIAZIDE 25 MG PO TABS: 25.0000 mg | ORAL_TABLET | Freq: Every day | ORAL | Status: DC

## 2011-08-20 DIAGNOSIS — R062 Wheezing: Secondary | ICD-10-CM | POA: Diagnosis not present

## 2011-08-20 DIAGNOSIS — J209 Acute bronchitis, unspecified: Secondary | ICD-10-CM | POA: Diagnosis not present

## 2011-08-20 DIAGNOSIS — R5381 Other malaise: Secondary | ICD-10-CM | POA: Diagnosis not present

## 2011-08-20 DIAGNOSIS — R5383 Other fatigue: Secondary | ICD-10-CM | POA: Diagnosis not present

## 2011-09-20 ENCOUNTER — Other Ambulatory Visit: Payer: Self-pay | Admitting: Cardiology

## 2011-09-20 MED ORDER — VALSARTAN 80 MG PO TABS: 80.0000 mg | ORAL_TABLET | Freq: Every day | ORAL | Status: DC

## 2011-09-24 DIAGNOSIS — K5732 Diverticulitis of large intestine without perforation or abscess without bleeding: Secondary | ICD-10-CM | POA: Diagnosis not present

## 2011-09-24 DIAGNOSIS — R5383 Other fatigue: Secondary | ICD-10-CM | POA: Diagnosis not present

## 2011-10-06 DIAGNOSIS — L570 Actinic keratosis: Secondary | ICD-10-CM | POA: Diagnosis not present

## 2011-10-06 DIAGNOSIS — D485 Neoplasm of uncertain behavior of skin: Secondary | ICD-10-CM | POA: Diagnosis not present

## 2011-10-18 ENCOUNTER — Other Ambulatory Visit: Payer: Self-pay | Admitting: Cardiology

## 2011-10-18 MED ORDER — HYDROCHLOROTHIAZIDE 25 MG PO TABS: 25.0000 mg | ORAL_TABLET | Freq: Every day | ORAL | Status: DC

## 2011-10-18 MED ORDER — VALSARTAN 80 MG PO TABS: 80.0000 mg | ORAL_TABLET | Freq: Every day | ORAL | Status: DC

## 2011-11-18 ENCOUNTER — Other Ambulatory Visit: Payer: Self-pay | Admitting: *Deleted

## 2011-11-18 ENCOUNTER — Other Ambulatory Visit: Payer: Self-pay

## 2011-11-18 MED ORDER — VALSARTAN 80 MG PO TABS: 80.0000 mg | ORAL_TABLET | Freq: Every day | ORAL | Status: DC

## 2011-11-19 ENCOUNTER — Other Ambulatory Visit: Payer: Self-pay | Admitting: *Deleted

## 2011-11-19 MED ORDER — HYDROCHLOROTHIAZIDE 25 MG PO TABS: 25.0000 mg | ORAL_TABLET | Freq: Every day | ORAL | Status: DC

## 2011-12-09 ENCOUNTER — Ambulatory Visit: Payer: Medicare Other | Admitting: Cardiology

## 2011-12-22 ENCOUNTER — Other Ambulatory Visit: Payer: Self-pay | Admitting: *Deleted

## 2011-12-22 MED ORDER — VALSARTAN 80 MG PO TABS: 80.0000 mg | ORAL_TABLET | Freq: Every day | ORAL | Status: DC

## 2012-01-06 ENCOUNTER — Encounter: Payer: Self-pay | Admitting: Cardiology

## 2012-01-06 ENCOUNTER — Ambulatory Visit (INDEPENDENT_AMBULATORY_CARE_PROVIDER_SITE_OTHER): Payer: Medicare Other | Admitting: Cardiology

## 2012-01-06 VITALS — BP 120/70 | HR 70 | Ht 63.0 in | Wt 151.0 lb

## 2012-01-06 DIAGNOSIS — I491 Atrial premature depolarization: Secondary | ICD-10-CM | POA: Insufficient documentation

## 2012-01-06 DIAGNOSIS — I10 Essential (primary) hypertension: Secondary | ICD-10-CM

## 2012-01-06 NOTE — Progress Notes (Signed)
   Cassandra Lang Date of Birth: 09-28-1924   History of Present Illness: Cassandra Lang is seen for followup today. She has a history of PACs, hypertension, and hyperlipidemia. In general she is doing well. She is still grieving over the loss of her husband this year. After his passing she did experience a lot of palpitations but these have since resolved. She denies any chest pain, shortness of breath, or edema.  Current Outpatient Prescriptions on File Prior to Visit  Medication Sig Dispense Refill  . acetaminophen (TYLENOL) 325 MG tablet Take 650 mg by mouth every 6 (six) hours as needed.        Marland Kitchen aspirin 81 MG tablet Take 81 mg by mouth daily.        Marland Kitchen atorvastatin (LIPITOR) 10 MG tablet Take 10 mg by mouth daily.        . Calcium Carbonate-Vitamin D (CALCIUM + D PO) Take by mouth 2 (two) times daily.        . DiphenhydrAMINE HCl (BENADRYL ALLERGY PO) Take by mouth as needed.        . hydrochlorothiazide (HYDRODIURIL) 25 MG tablet Take 1 tablet (25 mg total) by mouth daily.  30 tablet  11  . metoprolol tartrate (LOPRESSOR) 25 MG tablet Take 25 mg by mouth 2 (two) times daily.        . valsartan (DIOVAN) 80 MG tablet Take 1 tablet (80 mg total) by mouth daily.  30 tablet  2    Allergies  Allergen Reactions  . Penicillins     Past Medical History  Diagnosis Date  . Hypertension   . Hyperlipidemia   . Breast cancer   . Palpitations     Past Surgical History  Procedure Date  . Tumor removal     FROM FALLOPIAN TUBE  . Mastectomy partial / lumpectomy   . Abdominal hysterectomy     History  Smoking status  . Never Smoker   Smokeless tobacco  . Not on file    History  Alcohol Use No    Family History  Problem Relation Age of Onset  . Kidney disease Father     Review of Systems:   All other systems were reviewed and are negative.  Physical Exam: BP 120/70  Pulse 70  Ht 5\' 3"  (1.6 m)  Wt 151 lb (68.493 kg)  BMI 26.75 kg/m2 She is a pleasant elderly female in no  distress. Her HEENT exam is unremarkable. She has no JVD or bruits. Lungs are clear. Cardiac exam reveals a regular rate and rhythm without gallop, murmur, or click. Abdomen is soft and nontender. She has no masses or bruits. Pulses are good throughout. She has no edema. Neurologic exam is nonfocal. LABORATORY DATA: ECG today is normal.   Assessment / Plan: 1. PACs. Well controlled on beta blocker therapy. We will plan followup again in one year or as needed.  2. Hypertension, controlled.

## 2012-01-06 NOTE — Patient Instructions (Signed)
Continue your current therapy  I will see you again in one year.   

## 2012-01-14 ENCOUNTER — Other Ambulatory Visit: Payer: Self-pay | Admitting: Internal Medicine

## 2012-01-14 DIAGNOSIS — Z23 Encounter for immunization: Secondary | ICD-10-CM | POA: Diagnosis not present

## 2012-01-14 DIAGNOSIS — N644 Mastodynia: Secondary | ICD-10-CM | POA: Diagnosis not present

## 2012-01-14 DIAGNOSIS — I10 Essential (primary) hypertension: Secondary | ICD-10-CM | POA: Diagnosis not present

## 2012-01-14 DIAGNOSIS — H1030 Unspecified acute conjunctivitis, unspecified eye: Secondary | ICD-10-CM | POA: Diagnosis not present

## 2012-01-14 DIAGNOSIS — N6459 Other signs and symptoms in breast: Secondary | ICD-10-CM

## 2012-01-18 ENCOUNTER — Other Ambulatory Visit: Payer: Self-pay | Admitting: Internal Medicine

## 2012-01-18 ENCOUNTER — Ambulatory Visit
Admission: RE | Admit: 2012-01-18 | Discharge: 2012-01-18 | Disposition: A | Discharge status: Home / Self Care | Payer: Medicare Other | Source: Ambulatory Visit | Attending: Internal Medicine | Admitting: Internal Medicine

## 2012-01-18 DIAGNOSIS — Z853 Personal history of malignant neoplasm of breast: Secondary | ICD-10-CM | POA: Diagnosis not present

## 2012-01-18 DIAGNOSIS — N6459 Other signs and symptoms in breast: Secondary | ICD-10-CM | POA: Diagnosis not present

## 2012-01-24 ENCOUNTER — Other Ambulatory Visit: Payer: Self-pay | Admitting: Internal Medicine

## 2012-01-24 ENCOUNTER — Ambulatory Visit
Admission: RE | Admit: 2012-01-24 | Discharge: 2012-01-24 | Disposition: A | Discharge status: Home / Self Care | Payer: Medicare Other | Source: Ambulatory Visit | Attending: Internal Medicine | Admitting: Internal Medicine

## 2012-01-24 DIAGNOSIS — N6459 Other signs and symptoms in breast: Secondary | ICD-10-CM | POA: Diagnosis not present

## 2012-01-24 DIAGNOSIS — N6019 Diffuse cystic mastopathy of unspecified breast: Secondary | ICD-10-CM | POA: Diagnosis not present

## 2012-02-01 DIAGNOSIS — E785 Hyperlipidemia, unspecified: Secondary | ICD-10-CM | POA: Diagnosis not present

## 2012-02-01 DIAGNOSIS — I10 Essential (primary) hypertension: Secondary | ICD-10-CM | POA: Diagnosis not present

## 2012-02-01 DIAGNOSIS — M810 Age-related osteoporosis without current pathological fracture: Secondary | ICD-10-CM | POA: Diagnosis not present

## 2012-02-08 DIAGNOSIS — L570 Actinic keratosis: Secondary | ICD-10-CM | POA: Diagnosis not present

## 2012-02-08 DIAGNOSIS — L820 Inflamed seborrheic keratosis: Secondary | ICD-10-CM | POA: Diagnosis not present

## 2012-02-15 ENCOUNTER — Encounter (INDEPENDENT_AMBULATORY_CARE_PROVIDER_SITE_OTHER): Payer: Self-pay | Admitting: Surgery

## 2012-02-15 ENCOUNTER — Ambulatory Visit (INDEPENDENT_AMBULATORY_CARE_PROVIDER_SITE_OTHER): Payer: Medicare Other | Admitting: Surgery

## 2012-02-15 VITALS — BP 120/70 | HR 64 | Temp 97.0°F | Resp 18 | Ht 63.0 in | Wt 153.8 lb

## 2012-02-15 DIAGNOSIS — N6459 Other signs and symptoms in breast: Secondary | ICD-10-CM | POA: Diagnosis not present

## 2012-02-15 NOTE — Progress Notes (Signed)
NAME: Cassandra Lang DOB: 1924-12-03 MRN: [id number]                                                                                      DATE: 02/15/2012  PCP: Kari Baars, MD Referring Provider: Rosendo Gros, MD  IMPRESSION:  Nipple inversion abnormality on mammogram of uncertain etiology History of right breast cancer status post lumpectomy and radiation therapy Hematoma from a core biopsy of left breast  PLAN:  I plan to reexamine her in about 4 weeks to allow this hematoma time to resolve. I don't think a surgical biopsy at this time would be appropriate as it can be complicated by the hematoma. There is not appear to be a drainable hematoma.                 CC:  Chief Complaint  Patient presents with  . Breast Problem    left    HPI:  Cassandra Lang is a 78 y.o.  female who presents for evaluation of questionable left breast mass. She noted that she has always had some left nipple inversion but noticed a purplish discoloration. A mammogram was done in a mild density was found. A needle core biopsy was done whichwas benign. However this is discordant from the mammographic appearance and surgical excisional biopsy was recommended. The patient notess he's gotten a large hematoma and bruising from the biopsy.  She had a right-sided lumpectomy many years ago and has had significant deformity and fibrosis from the radiation. This apparently has been stable, but I don't have the last office visit which is still in the paper chart and the record has been sent for.  PMH:  has a past medical history of Hypertension; Hyperlipidemia; Breast cancer; and Palpitations.  PSH:   has past surgical history that includes Tumor removal; Mastectomy partial / lumpectomy; and Abdominal hysterectomy.  ALLERGIES:   Allergies  Allergen Reactions  . Penicillins     MEDICATIONS: Current outpatient prescriptions:acetaminophen (TYLENOL) 325 MG tablet, Take 650 mg by mouth every 6 (six) hours as needed.  ,  Disp: , Rfl: ;  aspirin 81 MG tablet, Take 81 mg by mouth daily.  , Disp: , Rfl: ;  atorvastatin (LIPITOR) 10 MG tablet, Take 10 mg by mouth daily.  , Disp: , Rfl: ;  Calcium Carbonate-Vitamin D (CALCIUM + D PO), Take by mouth 2 (two) times daily.  , Disp: , Rfl:  DiphenhydrAMINE HCl (BENADRYL ALLERGY PO), Take by mouth as needed.  , Disp: , Rfl: ;  hydrochlorothiazide (HYDRODIURIL) 25 MG tablet, Take 1 tablet (25 mg total) by mouth daily., Disp: 30 tablet, Rfl: 11;  metoprolol tartrate (LOPRESSOR) 25 MG tablet, Take 25 mg by mouth 2 (two) times daily.  , Disp: , Rfl: ;  Multiple Vitamins-Minerals (MULTIVITAMIN PO), Take by mouth daily., Disp: , Rfl:  valsartan (DIOVAN) 80 MG tablet, Take 1 tablet (80 mg total) by mouth daily., Disp: 30 tablet, Rfl: 2  ROS: She has filled out our 12 point review of systems and it is negative . EXAM:   Vital signs:BP 120/70  Pulse 64  Temp 97 F (36.1 C) (Oral)  Resp 18  Ht 5\' 3"  (1.6 m)  Wt 153 lb 12.8 oz (69.763 kg)  BMI 27.24 kg/m2 General: The patient is alert oriented and healthy appearing. She looks younger than her stated age. Breasts: The right breast is shrunken and much smaller than the left. There is marked fibrosis laterally in the area is quite hard. I believe this is stable from her last exam but I don't have those notes yet available. The left breast has a large ecchymosis and I believe a large hematoma 12:00 position. The nipple is inverted. There is no specific dominant mass other than a hematoma. There are no skin changes to suggest an inflammatory cancer.  Lymphatics: There is no axillary or supraclavicular adenopathy noted on either side.  DATA REVIEWED:  I reviewed the mammogram films and reports, the ultrasound reports and films, and the pathology report.    Vickie Ponds J 02/15/2012  CC: Rosendo Gros, MD, Kari Baars, MD

## 2012-02-15 NOTE — Patient Instructions (Signed)
I will need to re-examine you in about four weeks and make a decision then about surgical biopsy

## 2012-03-17 ENCOUNTER — Other Ambulatory Visit: Payer: Self-pay | Admitting: *Deleted

## 2012-03-17 MED ORDER — VALSARTAN 80 MG PO TABS: 80.0000 mg | ORAL_TABLET | Freq: Every day | ORAL | Status: DC

## 2012-03-21 ENCOUNTER — Ambulatory Visit (INDEPENDENT_AMBULATORY_CARE_PROVIDER_SITE_OTHER): Payer: Medicare Other | Admitting: Surgery

## 2012-03-21 ENCOUNTER — Other Ambulatory Visit (INDEPENDENT_AMBULATORY_CARE_PROVIDER_SITE_OTHER): Payer: Self-pay | Admitting: Surgery

## 2012-03-21 ENCOUNTER — Encounter (INDEPENDENT_AMBULATORY_CARE_PROVIDER_SITE_OTHER): Payer: Self-pay | Admitting: Surgery

## 2012-03-21 VITALS — BP 134/68 | HR 64 | Temp 97.4°F | Resp 16 | Ht 63.5 in | Wt 153.6 lb

## 2012-03-21 DIAGNOSIS — N63 Unspecified lump in unspecified breast: Secondary | ICD-10-CM

## 2012-03-21 DIAGNOSIS — N6320 Unspecified lump in the left breast, unspecified quadrant: Secondary | ICD-10-CM | POA: Insufficient documentation

## 2012-03-21 HISTORY — DX: Unspecified lump in the left breast, unspecified quadrant: N63.20

## 2012-03-21 NOTE — Patient Instructions (Signed)
We will schedule surgery to remove the abnormal area in your left breast and do a small biopsy of your left nipple to see if either of these areas are cancer

## 2012-03-21 NOTE — Progress Notes (Signed)
NAME: Cassandra Lang DOB: 1924/08/21 MRN: [id number]                                                                                      DATE: 03/21/2012  PCP: Kari Baars, MD Referring Provider: Kari Baars, MD  IMPRESSION:  Left breast mass and left nipple abnormality Resolved breast hematoma s/p NCB History of Right breast cancer History of colitis  PLAN:   NL excision of left breast mass and biopsy of nipple I have discussed the indications for the lumpectomy and described the procedure. She understand that the chance of removal of the abnormal area is very good, but that occasionally we are unable to locate it and may have to do a second procedure. We also discussed the possibility of a second procedure to get additional tissue. Risks of surgery such as bleeding and infection have also been explained, as well as the implications of not doing the surgery. She understands and wishes to proceed.  GIVEN HER HISTORY OF COLITIS, WILL DEFER PERI-OPERATIVE ANTIBIOTICS                 CC:  Chief Complaint  Patient presents with  . Follow-up    60mo reck br/ hematoma    HPI:  Cassandra Lang is a 78 y.o.  female who presents for evaluation of her left breast. An abnormality was seen in the left breast on mammogram and she's had some change in the left nipple with increased retraction. A needle core biopsy was done which showed sclerotic tissue, not concordant with the mammographic appearance. She developed a post biopsy hematoma. I saw her shortly after that and recommended that we allow the hematoma to resolve and then proceed with a wire localized biopsy of the mammographic we found mass as well as a small biopsy of the nipple itself.  She comes in today for a followup visit. She thinks the hematoma has completely resolved. She has not had any other breast symptoms. There's been no additional change in her nipple appearance since I saw her previously.  PMH:  has a past medical history of  Hypertension; Hyperlipidemia; Breast cancer; and Palpitations.  PSH:   has past surgical history that includes Tumor removal; Mastectomy partial / lumpectomy; and Abdominal hysterectomy.  ALLERGIES:   Allergies  Allergen Reactions  . Penicillins     MEDICATIONS: Current outpatient prescriptions:acetaminophen (TYLENOL) 325 MG tablet, Take 650 mg by mouth every 6 (six) hours as needed.  , Disp: , Rfl: ;  aspirin 81 MG tablet, Take 81 mg by mouth daily.  , Disp: , Rfl: ;  atorvastatin (LIPITOR) 10 MG tablet, Take 10 mg by mouth daily.  , Disp: , Rfl: ;  Calcium Carbonate-Vitamin D (CALCIUM + D PO), Take by mouth 2 (two) times daily.  , Disp: , Rfl:  DiphenhydrAMINE HCl (BENADRYL ALLERGY PO), Take by mouth as needed.  , Disp: , Rfl: ;  hydrochlorothiazide (HYDRODIURIL) 25 MG tablet, Take 1 tablet (25 mg total) by mouth daily., Disp: 30 tablet, Rfl: 11;  Multiple Vitamins-Minerals (MULTIVITAMIN PO), Take by mouth daily., Disp: , Rfl: ;  valsartan (DIOVAN) 80 MG tablet,  Take 1 tablet (80 mg total) by mouth daily., Disp: 30 tablet, Rfl: 6  ROS: She has filled out our 12 point review of systems previously. EXAM:   VITAL SIGNS: BP 134/68  Pulse 64  Temp 97.4 F (36.3 C) (Temporal)  Resp 16  Ht 5' 3.5" (1.613 m)  Wt 153 lb 9.6 oz (69.673 kg)  BMI 26.78 kg/m2 GENERAL:  The patient is alert, oriented, and generally healthy-appearing, NAD. Mood and affect are normal.  HEENT:  The head is normocephalic, the eyes nonicteric, the pupils were round regular and equal. EOMs are normal. Pharynx normal. Dentition good.  NECK:  The neck is supple and there are no masses or thyromegaly.  LUNGS: Normal respirations and clear to auscultation.  HEART: Regular rhythm, with no murmurs rubs or gallops. Pulses are intact carotid dorsalis pedis and posterior tibial. No significant varicosities are noted.  BREASTS:  the right breast is status post lumpectomy which was done laterally and radiation therapy. Is  small hard contracted. This is been stable over many years. It remains tender. The left breast continues to show little residual bruising around the areolar complex but it's much softer and is not be hard hematoma that was previously noted. The nipple continues to be inverted a little bit and there is some papillary type growth on the surface. There is no  Definite mass. LYMPHATICS: There is no axillary or supraclavicular adenopathy on either side. ABDOMEN: Soft, flat, and nontender. No masses or organomegaly is noted. No hernias are noted. Bowel sounds are normal.  EXTREMITIES:  Good range of motion, no edema.   DATA REVIEWED:  I have reviewed again my prior notes as well as the mammogram films and biopsy reports. I also found her old paper chart from her original surgery and reviewed as well.    Eugenio Dollins J 03/21/2012  CC: Kari Baars, MD, Kari Baars, MD

## 2012-03-27 ENCOUNTER — Other Ambulatory Visit: Payer: Self-pay

## 2012-03-27 MED ORDER — HYDROCHLOROTHIAZIDE 25 MG PO TABS: 25.0000 mg | ORAL_TABLET | Freq: Every day | ORAL | Status: DC

## 2012-04-17 DIAGNOSIS — H25019 Cortical age-related cataract, unspecified eye: Secondary | ICD-10-CM | POA: Diagnosis not present

## 2012-04-17 DIAGNOSIS — H52209 Unspecified astigmatism, unspecified eye: Secondary | ICD-10-CM | POA: Diagnosis not present

## 2012-04-25 ENCOUNTER — Ambulatory Visit: Admit: 2012-04-25 | Payer: Self-pay | Admitting: Surgery

## 2012-04-25 ENCOUNTER — Encounter (HOSPITAL_BASED_OUTPATIENT_CLINIC_OR_DEPARTMENT_OTHER): Payer: Self-pay | Admitting: *Deleted

## 2012-04-25 SURGERY — BREAST BIOPSY WITH NEEDLE LOCALIZATION
Anesthesia: General | Site: Breast | Laterality: Left

## 2012-04-25 NOTE — Progress Notes (Signed)
Pt was widowed 2013-she drives and cares for herself-will come in for bmet-she will see if daughter can stay with her post op-if not will call dr Jamey Ripa about overnight room-to bring all meds and overnight bag DOS Sees dr [place] for [facility] and htn-no cad

## 2012-04-26 ENCOUNTER — Encounter (HOSPITAL_BASED_OUTPATIENT_CLINIC_OR_DEPARTMENT_OTHER)
Admission: RE | Admit: 2012-04-26 | Discharge: 2012-04-26 | Disposition: A | Discharge status: Home / Self Care | Payer: Medicare Other | Source: Ambulatory Visit | Attending: Surgery | Admitting: Surgery

## 2012-04-26 DIAGNOSIS — N6089 Other benign mammary dysplasias of unspecified breast: Secondary | ICD-10-CM | POA: Diagnosis not present

## 2012-04-26 DIAGNOSIS — N6039 Fibrosclerosis of unspecified breast: Secondary | ICD-10-CM | POA: Diagnosis not present

## 2012-04-26 DIAGNOSIS — I10 Essential (primary) hypertension: Secondary | ICD-10-CM | POA: Diagnosis not present

## 2012-04-26 DIAGNOSIS — N63 Unspecified lump in unspecified breast: Secondary | ICD-10-CM | POA: Diagnosis not present

## 2012-04-26 DIAGNOSIS — Z79899 Other long term (current) drug therapy: Secondary | ICD-10-CM | POA: Diagnosis not present

## 2012-04-26 DIAGNOSIS — Z7982 Long term (current) use of aspirin: Secondary | ICD-10-CM | POA: Diagnosis not present

## 2012-04-26 DIAGNOSIS — E785 Hyperlipidemia, unspecified: Secondary | ICD-10-CM | POA: Diagnosis not present

## 2012-04-26 DIAGNOSIS — Z01812 Encounter for preprocedural laboratory examination: Secondary | ICD-10-CM | POA: Diagnosis not present

## 2012-04-26 LAB — BASIC METABOLIC PANEL
Chloride: 99 mEq/L (ref 96–112)
Creatinine, Ser: 0.98 mg/dL (ref 0.50–1.10)
GFR calc Af Amer: 59 mL/min — ABNORMAL LOW (ref >90)
GFR calc non Af Amer: 51 mL/min — ABNORMAL LOW (ref >90)
Potassium: 4.4 mEq/L (ref 3.5–5.1)

## 2012-04-28 NOTE — Progress Notes (Signed)
Sent e mail to Dr Jamey Ripa to review lab results

## 2012-05-01 ENCOUNTER — Encounter (HOSPITAL_BASED_OUTPATIENT_CLINIC_OR_DEPARTMENT_OTHER): Payer: Self-pay | Admitting: Anesthesiology

## 2012-05-01 ENCOUNTER — Ambulatory Visit (HOSPITAL_BASED_OUTPATIENT_CLINIC_OR_DEPARTMENT_OTHER): Payer: Medicare Other | Admitting: Anesthesiology

## 2012-05-01 ENCOUNTER — Ambulatory Visit
Admission: RE | Admit: 2012-05-01 | Discharge: 2012-05-01 | Disposition: A | Discharge status: Home / Self Care | Payer: Medicare Other | Source: Ambulatory Visit | Attending: Surgery | Admitting: Surgery

## 2012-05-01 ENCOUNTER — Encounter (HOSPITAL_BASED_OUTPATIENT_CLINIC_OR_DEPARTMENT_OTHER)
Admission: RE | Disposition: A | Discharge status: Home / Self Care | Payer: Self-pay | Source: Ambulatory Visit | Attending: Surgery

## 2012-05-01 ENCOUNTER — Ambulatory Visit (HOSPITAL_BASED_OUTPATIENT_CLINIC_OR_DEPARTMENT_OTHER)
Admission: RE | Admit: 2012-05-01 | Discharge: 2012-05-01 | Disposition: A | Discharge status: Home / Self Care | Payer: Medicare Other | Source: Ambulatory Visit | Attending: Surgery | Admitting: Surgery

## 2012-05-01 ENCOUNTER — Encounter (HOSPITAL_BASED_OUTPATIENT_CLINIC_OR_DEPARTMENT_OTHER): Payer: Self-pay

## 2012-05-01 ENCOUNTER — Other Ambulatory Visit (INDEPENDENT_AMBULATORY_CARE_PROVIDER_SITE_OTHER): Payer: Self-pay | Admitting: Surgery

## 2012-05-01 DIAGNOSIS — N6320 Unspecified lump in the left breast, unspecified quadrant: Secondary | ICD-10-CM | POA: Diagnosis present

## 2012-05-01 DIAGNOSIS — E785 Hyperlipidemia, unspecified: Secondary | ICD-10-CM | POA: Insufficient documentation

## 2012-05-01 DIAGNOSIS — N6009 Solitary cyst of unspecified breast: Secondary | ICD-10-CM | POA: Diagnosis not present

## 2012-05-01 DIAGNOSIS — Z7982 Long term (current) use of aspirin: Secondary | ICD-10-CM | POA: Insufficient documentation

## 2012-05-01 DIAGNOSIS — N6039 Fibrosclerosis of unspecified breast: Secondary | ICD-10-CM | POA: Insufficient documentation

## 2012-05-01 DIAGNOSIS — I10 Essential (primary) hypertension: Secondary | ICD-10-CM | POA: Insufficient documentation

## 2012-05-01 DIAGNOSIS — Z01812 Encounter for preprocedural laboratory examination: Secondary | ICD-10-CM | POA: Insufficient documentation

## 2012-05-01 DIAGNOSIS — N63 Unspecified lump in unspecified breast: Secondary | ICD-10-CM | POA: Insufficient documentation

## 2012-05-01 DIAGNOSIS — N6459 Other signs and symptoms in breast: Secondary | ICD-10-CM | POA: Diagnosis not present

## 2012-05-01 DIAGNOSIS — N6019 Diffuse cystic mastopathy of unspecified breast: Secondary | ICD-10-CM | POA: Diagnosis not present

## 2012-05-01 DIAGNOSIS — Z79899 Other long term (current) drug therapy: Secondary | ICD-10-CM | POA: Diagnosis not present

## 2012-05-01 DIAGNOSIS — N6089 Other benign mammary dysplasias of unspecified breast: Secondary | ICD-10-CM | POA: Insufficient documentation

## 2012-05-01 HISTORY — PX: BREAST EXCISIONAL BIOPSY: SUR124

## 2012-05-01 HISTORY — DX: Unspecified osteoarthritis, unspecified site: M19.90

## 2012-05-01 HISTORY — PX: BREAST BIOPSY: SHX20

## 2012-05-01 HISTORY — DX: Noninfective gastroenteritis and colitis, unspecified: K52.9

## 2012-05-01 SURGERY — BREAST BIOPSY WITH NEEDLE LOCALIZATION
Anesthesia: General | Site: Breast | Laterality: Left | Wound class: Clean

## 2012-05-01 MED ORDER — EPHEDRINE SULFATE 50 MG/ML IJ SOLN
INTRAMUSCULAR | Status: DC | PRN
  Administered 2012-05-01 (×2): 10 mg via INTRAVENOUS

## 2012-05-01 MED ORDER — BUPIVACAINE HCL (PF) 0.25 % IJ SOLN
INTRAMUSCULAR | Status: DC | PRN
  Administered 2012-05-01: 10 mL

## 2012-05-01 MED ORDER — HYDROCODONE-ACETAMINOPHEN 5-325 MG PO TABS: 1.0000 | ORAL_TABLET | ORAL | Status: DC | PRN

## 2012-05-01 MED ORDER — PROPOFOL 10 MG/ML IV BOLUS
INTRAVENOUS | Status: DC | PRN
  Administered 2012-05-01: 100 mg via INTRAVENOUS

## 2012-05-01 MED ORDER — ACETAMINOPHEN 10 MG/ML IV SOLN
INTRAVENOUS | Status: DC | PRN
  Administered 2012-05-01: 1000 mg via INTRAVENOUS

## 2012-05-01 MED ORDER — PROMETHAZINE HCL 25 MG/ML IJ SOLN: 6.2500 mg | INTRAMUSCULAR | Status: DC | PRN

## 2012-05-01 MED ORDER — LACTATED RINGERS IV SOLN
INTRAVENOUS | Status: DC
  Administered 2012-05-01: 10:00:00 via INTRAVENOUS

## 2012-05-01 MED ORDER — FENTANYL CITRATE 0.05 MG/ML IJ SOLN
25.0000 ug | INTRAMUSCULAR | Status: DC | PRN
  Administered 2012-05-01: 25 ug via INTRAVENOUS

## 2012-05-01 MED ORDER — METOPROLOL TARTRATE 25 MG PO TABS
25.0000 mg | ORAL_TABLET | Freq: Two times a day (BID) | ORAL | Status: AC
  Administered 2012-05-01: 25 mg via ORAL

## 2012-05-01 MED ORDER — OXYCODONE HCL 5 MG PO TABS: 5.0000 mg | ORAL_TABLET | Freq: Once | ORAL | Status: DC | PRN

## 2012-05-01 MED ORDER — CHLORHEXIDINE GLUCONATE 4 % EX LIQD: 1.0000 "application " | Freq: Once | CUTANEOUS | Status: DC

## 2012-05-01 MED ORDER — MIDAZOLAM HCL 2 MG/2ML IJ SOLN: 0.5000 mg | Freq: Once | INTRAMUSCULAR | Status: DC | PRN

## 2012-05-01 MED ORDER — DEXAMETHASONE SODIUM PHOSPHATE 4 MG/ML IJ SOLN
INTRAMUSCULAR | Status: DC | PRN
  Administered 2012-05-01: 5 mg via INTRAVENOUS

## 2012-05-01 MED ORDER — ONDANSETRON HCL 4 MG/2ML IJ SOLN
INTRAMUSCULAR | Status: DC | PRN
  Administered 2012-05-01: 4 mg via INTRAVENOUS

## 2012-05-01 MED ORDER — FENTANYL CITRATE 0.05 MG/ML IJ SOLN
INTRAMUSCULAR | Status: DC | PRN
  Administered 2012-05-01: 100 ug via INTRAVENOUS

## 2012-05-01 MED ORDER — LIDOCAINE HCL (CARDIAC) 20 MG/ML IV SOLN
INTRAVENOUS | Status: DC | PRN
  Administered 2012-05-01: 30 mg via INTRAVENOUS

## 2012-05-01 MED ORDER — OXYCODONE HCL 5 MG/5ML PO SOLN: 5.0000 mg | Freq: Once | ORAL | Status: DC | PRN

## 2012-05-01 MED ORDER — MEPERIDINE HCL 25 MG/ML IJ SOLN: 6.2500 mg | INTRAMUSCULAR | Status: DC | PRN

## 2012-05-01 MED ORDER — ACETAMINOPHEN 10 MG/ML IV SOLN
1000.0000 mg | Freq: Once | INTRAVENOUS | Status: AC
  Administered 2012-05-01: 1000 mg via INTRAVENOUS

## 2012-05-01 SURGICAL SUPPLY — 46 items
APPLICATOR COTTON TIP 6IN STRL (MISCELLANEOUS) IMPLANT
BINDER BREAST LRG (GAUZE/BANDAGES/DRESSINGS) IMPLANT
BINDER BREAST MEDIUM (GAUZE/BANDAGES/DRESSINGS) IMPLANT
BINDER BREAST XLRG (GAUZE/BANDAGES/DRESSINGS) IMPLANT
BINDER BREAST XXLRG (GAUZE/BANDAGES/DRESSINGS) IMPLANT
BLADE HEX COATED 2.75 (ELECTRODE) ×2 IMPLANT
BLADE SURG 15 STRL LF DISP TIS (BLADE) ×1 IMPLANT
BLADE SURG 15 STRL SS (BLADE) ×1
CANISTER SUCTION 1200CC (MISCELLANEOUS) ×2 IMPLANT
CHLORAPREP W/TINT 26ML (MISCELLANEOUS) ×2 IMPLANT
CLIP TI MEDIUM 6 (CLIP) IMPLANT
CLIP TI WIDE RED SMALL 6 (CLIP) IMPLANT
CLOTH BEACON ORANGE TIMEOUT ST (SAFETY) ×2 IMPLANT
COVER MAYO STAND STRL (DRAPES) ×2 IMPLANT
COVER TABLE BACK 60X90 (DRAPES) ×2 IMPLANT
DECANTER SPIKE VIAL GLASS SM (MISCELLANEOUS) IMPLANT
DERMABOND ADVANCED (GAUZE/BANDAGES/DRESSINGS) ×1
DERMABOND ADVANCED .7 DNX12 (GAUZE/BANDAGES/DRESSINGS) ×1 IMPLANT
DEVICE DUBIN W/COMP PLATE 8390 (MISCELLANEOUS) IMPLANT
DRAPE LAPAROTOMY TRNSV 102X78 (DRAPE) ×2 IMPLANT
DRAPE SURG 17X23 STRL (DRAPES) ×2 IMPLANT
DRAPE UTILITY XL STRL (DRAPES) ×2 IMPLANT
ELECT REM PT RETURN 9FT ADLT (ELECTROSURGICAL) ×2
ELECTRODE REM PT RTRN 9FT ADLT (ELECTROSURGICAL) ×1 IMPLANT
GLOVE EUDERMIC 7 POWDERFREE (GLOVE) ×2 IMPLANT
GOWN PREVENTION PLUS XLARGE (GOWN DISPOSABLE) ×4 IMPLANT
KIT MARKER MARGIN INK (KITS) IMPLANT
NEEDLE HYPO 25X1 1.5 SAFETY (NEEDLE) ×2 IMPLANT
NS IRRIG 1000ML POUR BTL (IV SOLUTION) ×2 IMPLANT
PACK BASIN DAY SURGERY FS (CUSTOM PROCEDURE TRAY) ×2 IMPLANT
PENCIL BUTTON HOLSTER BLD 10FT (ELECTRODE) ×2 IMPLANT
PUNCH BIOPSY DERMAL 3MM (MISCELLANEOUS) ×2 IMPLANT
SHEET MEDIUM DRAPE 40X70 STRL (DRAPES) ×2 IMPLANT
SLEEVE SCD COMPRESS KNEE MED (MISCELLANEOUS) ×2 IMPLANT
SPONGE GAUZE 4X4 12PLY (GAUZE/BANDAGES/DRESSINGS) IMPLANT
SPONGE INTESTINAL PEANUT (DISPOSABLE) IMPLANT
SPONGE LAP 4X18 X RAY DECT (DISPOSABLE) ×2 IMPLANT
STAPLER VISISTAT 35W (STAPLE) IMPLANT
SUT MNCRL AB 4-0 PS2 18 (SUTURE) ×2 IMPLANT
SUT SILK 0 TIES 10X30 (SUTURE) IMPLANT
SUT VICRYL 3-0 CR8 SH (SUTURE) ×2 IMPLANT
SYR CONTROL 10ML LL (SYRINGE) ×2 IMPLANT
TOWEL OR NON WOVEN STRL DISP B (DISPOSABLE) ×2 IMPLANT
TUBE CONNECTING 20X1/4 (TUBING) ×2 IMPLANT
WATER STERILE IRR 1000ML POUR (IV SOLUTION) IMPLANT
YANKAUER SUCT BULB TIP NO VENT (SUCTIONS) ×2 IMPLANT

## 2012-05-01 NOTE — Anesthesia Procedure Notes (Signed)
Procedure Name: LMA Insertion Date/Time: 05/01/2012 9:40 AM Performed by: Burna Cash Pre-anesthesia Checklist: Patient identified, Emergency Drugs available, Suction available and Patient being monitored Patient Re-evaluated:Patient Re-evaluated prior to inductionOxygen Delivery Method: Circle System Utilized Preoxygenation: Pre-oxygenation with 100% oxygen Intubation Type: IV induction Ventilation: Mask ventilation without difficulty LMA: LMA inserted LMA Size: 4.0 Number of attempts: 1 Airway Equipment and Method: bite block Placement Confirmation: positive ETCO2 Tube secured with: Tape Dental Injury: Teeth and Oropharynx as per pre-operative assessment

## 2012-05-01 NOTE — Anesthesia Postprocedure Evaluation (Signed)
  Anesthesia Post-op Note  Patient: Cassandra Lang  Procedure(s) Performed: Procedure(s) (LRB) with comments: BREAST BIOPSY WITH NEEDLE LOCALIZATION (Left) - needle localization left breast biopsy and left nipple biopsy  Patient Location: PACU  Anesthesia Type:General  Level of Consciousness: awake, alert , oriented and patient cooperative  Airway and Oxygen Therapy: Patient Spontanous Breathing  Post-op Pain: none  Post-op Assessment: Post-op Vital signs reviewed, Patient's Cardiovascular Status Stable, Respiratory Function Stable, Patent Airway, No signs of Nausea or vomiting and Pain level controlled  Post-op Vital Signs: Reviewed and stable  Complications: No apparent anesthesia complications

## 2012-05-01 NOTE — Op Note (Signed)
Cassandra Lang 08-06-1924 [id number] 03/23/2012  Preoperative diagnosis: 1. Left breast mass, with discordant core needle biopsy. 2 inverted left nipple with crusting  Postoperative diagnosis: same  Procedure: 1. Wire localized excision of left breast mass. 2 punch biopsy left nipple  Surgeon: Currie Paris, MD,   Anesthesia: General   Clinical History and Indications: this patient had a mammogram showing abnormality in the 12 to 1:00 position of the left breast. A needle core biopsy was discordant with the radiographic findings. She had a hematoma from the biopsy so we had to wait for that to resolve in order to do her biopsy today. In addition she has had crusting and inversion of the left nipple and we decided to do a punch biopsy of the nipple to be sure she is not have Paget's.    Description of Procedure: the patient was seen in the preoperative area. I reviewed the wire localizing films. Unremarkable left breast is operative side. The patient had no further questions.  She is taken to the operative room and after satisfactory general anesthesia was obtained the left breast was prepped and draped in a time out was performed. I did a 3 mm punch biopsy of the nipple. I then made a curvilinear incision at the areolar margin centered on a skin mark the radiologist had made when doing the guidewire localization. There is some evidence of old hematoma in the subcutaneous.I migrated the wire into the wound and excise the tissue around the wire going beyond the tip. A specimen mammogram showed the clip in good position within the specimen.  I irrigated made sure everything was dry. I elevated the breast tissue off of the nipple and put a pursestring around the base of the nipple to see if I could evert it.I irrigated. Everything was dry. I put 10 cc of 0.25% plain Marcaine in. I closed in layers with interrupted 3-0 Vicryl followed by 4-0 Monocryl subcuticular and Dermabond.  The patient  tolerated the procedure well. There were no complications. Counts were correct.  Currie Paris, MD, FACS 05/01/2012 10:17 AM

## 2012-05-01 NOTE — H&P (Signed)
  HPI: Cassandra Lang is a 79 y.o. female who presents for surgical biopsyof her left breast. An abnormality was seen in the left breast on mammogram and she's had some change in the left nipple with increased retraction. A needle core biopsy was done which showed sclerotic tissue, not concordant with the mammographic appearance. She developed a post biopsy hematoma This is now resolved.  PMH: has a past medical history of Hypertension; Hyperlipidemia; Breast cancer; and Palpitations.  PSH: has past surgical history that includes Tumor removal; Mastectomy partial / lumpectomy; and Abdominal hysterectomy.  ALLERGIES:  Allergies   Allergen  Reactions   .  Penicillins    MEDICATIONS: Current outpatient prescriptions:acetaminophen (TYLENOL) 325 MG tablet, Take 650 mg by mouth every 6 (six) hours as needed. , Disp: , Rfl: ; aspirin 81 MG tablet, Take 81 mg by mouth daily. , Disp: , Rfl: ; atorvastatin (LIPITOR) 10 MG tablet, Take 10 mg by mouth daily. , Disp: , Rfl: ; Calcium Carbonate-Vitamin D (CALCIUM + D PO), Take by mouth 2 (two) times daily. , Disp: , Rfl:  DiphenhydrAMINE HCl (BENADRYL ALLERGY PO), Take by mouth as needed. , Disp: , Rfl: ; hydrochlorothiazide (HYDRODIURIL) 25 MG tablet, Take 1 tablet (25 mg total) by mouth daily., Disp: 30 tablet, Rfl: 11; Multiple Vitamins-Minerals (MULTIVITAMIN PO), Take by mouth daily., Disp: , Rfl: ; valsartan (DIOVAN) 80 MG tablet, Take 1 tablet (80 mg total) by mouth daily., Disp: 30 tablet, Rfl: 6  ROS: She  has filled out our 12 point review of systems previously.  EXAM:  VITAL SIGNS: BP 134/68  Pulse 64  Temp 97.4 F (36.3 C) (Temporal)  Resp 16  Ht 5' 3.5" (1.613 m)  Wt 153 lb 9.6 oz (69.673 kg)  BMI 26.78 kg/m2  GENERAL:  The patient is alert, oriented, and generally healthy-appearing, NAD. Mood and affect are normal.  HEENT:  The head is normocephalic, the eyes nonicteric, the pupils were round regular and equal. EOMs are normal. Pharynx normal.  Dentition good.  NECK:  The neck is supple and there are no masses or thyromegaly.  LUNGS:  Normal respirations and clear to auscultation.  HEART:  Regular rhythm, with no murmurs rubs or gallops. Pulses are intact carotid dorsalis pedis and posterior tibial. No significant varicosities are noted.  BREASTS:  the right breast is status post lumpectomy which was done laterally and radiation therapy. Is small hard contracted. This is been stable over many years. It remains tender. The left breast continues to show little residual bruising around the areolar complex but it's much softer and is not be hard hematoma that was previously noted. The nipple continues to be inverted a little bit and there is some papillary type growth on the surface. There is no Definite mass.  LYMPHATICS:  There is no axillary or supraclavicular adenopathy on either side.  ABDOMEN:  Soft, flat, and nontender. No masses or organomegaly is noted. No hernias are noted. Bowel sounds are normal.  EXTREMITIES:  Good range of motion, no edema.  DATA REVIEWED: I have reviewed the wire loc films mammo and sono.  WUJ:WJXB breast mass and nipple abnormality of uncertain etiology  Plan, wire loc excision of the mass and biopsy of nipple. Reviewed the plans with the patient and marked the left breast as the operative side

## 2012-05-01 NOTE — Anesthesia Preprocedure Evaluation (Addendum)
Anesthesia Evaluation  Patient identified by MRN, date of birth, ID band Patient awake    Reviewed: Allergy & Precautions, H&P , NPO status , Patient's Chart, lab work & pertinent test results, reviewed documented beta blocker date and time   History of Anesthesia Complications Negative for: history of anesthetic complications  Airway Mallampati: II TM Distance: >3 FB Neck ROM: Full    Dental No notable dental hx. (+) Teeth Intact and Dental Advisory Given   Pulmonary neg pulmonary ROS,  breath sounds clear to auscultation  Pulmonary exam normal       Cardiovascular hypertension, Pt. on medications and Pt. on home beta blockers Rhythm:Regular Rate:Normal  '06 stress test: normal   Neuro/Psych negative neurological ROS     GI/Hepatic Neg liver ROS, GERD-  Medicated and Controlled,  Endo/Other  negative endocrine ROS  Renal/GU negative Renal ROS     Musculoskeletal   Abdominal   Peds  Hematology   Anesthesia Other Findings Breast cancer  Reproductive/Obstetrics                          Anesthesia Physical Anesthesia Plan  ASA: II  Anesthesia Plan: General   Post-op Pain Management:    Induction: Intravenous  Airway Management Planned: LMA  Additional Equipment:   Intra-op Plan:   Post-operative Plan:   Informed Consent: I have reviewed the patients History and Physical, chart, labs and discussed the procedure including the risks, benefits and alternatives for the proposed anesthesia with the patient or authorized representative who has indicated his/her understanding and acceptance.   Dental advisory given  Plan Discussed with: CRNA and Surgeon  Anesthesia Plan Comments: (Plan routine monitors, GA- LMA OK)        Anesthesia Quick Evaluation

## 2012-05-01 NOTE — Transfer of Care (Signed)
Immediate Anesthesia Transfer of Care Note  Patient: Cassandra Lang  Procedure(s) Performed: Procedure(s) (LRB) with comments: BREAST BIOPSY WITH NEEDLE LOCALIZATION (Left) - needle localization left breast biopsy and left nipple biopsy  Patient Location: PACU  Anesthesia Type:General  Level of Consciousness: awake, alert  and oriented  Airway & Oxygen Therapy: Patient Spontanous Breathing and Patient connected to face mask oxygen  Post-op Assessment: Report given to PACU RN and Post -op Vital signs reviewed and stable  Post vital signs: Reviewed and stable  Complications: No apparent anesthesia complications

## 2012-05-02 ENCOUNTER — Encounter (HOSPITAL_BASED_OUTPATIENT_CLINIC_OR_DEPARTMENT_OTHER): Payer: Self-pay | Admitting: Surgery

## 2012-05-04 ENCOUNTER — Telehealth (INDEPENDENT_AMBULATORY_CARE_PROVIDER_SITE_OTHER): Payer: Self-pay | Admitting: General Surgery

## 2012-05-04 NOTE — Telephone Encounter (Signed)
Message copied by Liliana Cline on Thu May 04, 2012 12:51 PM ------      Message from: Currie Paris      Created: Wed May 03, 2012  5:30 PM       Tell her path is benign and as expected

## 2012-05-04 NOTE — Telephone Encounter (Signed)
Patient made aware of path results. Will follow up at appt and call with any questions prior.  

## 2012-05-11 ENCOUNTER — Encounter (INDEPENDENT_AMBULATORY_CARE_PROVIDER_SITE_OTHER): Payer: Medicare Other | Admitting: Surgery

## 2012-05-13 DIAGNOSIS — M76899 Other specified enthesopathies of unspecified lower limb, excluding foot: Secondary | ICD-10-CM | POA: Diagnosis not present

## 2012-05-16 ENCOUNTER — Encounter (INDEPENDENT_AMBULATORY_CARE_PROVIDER_SITE_OTHER): Payer: Self-pay | Admitting: Surgery

## 2012-05-16 ENCOUNTER — Ambulatory Visit (INDEPENDENT_AMBULATORY_CARE_PROVIDER_SITE_OTHER): Payer: Medicare Other | Admitting: Surgery

## 2012-05-16 VITALS — BP 116/72 | HR 78 | Resp 16 | Ht 63.5 in | Wt 155.0 lb

## 2012-05-16 DIAGNOSIS — N63 Unspecified lump in unspecified breast: Secondary | ICD-10-CM

## 2012-05-16 DIAGNOSIS — N6320 Unspecified lump in the left breast, unspecified quadrant: Secondary | ICD-10-CM

## 2012-05-16 DIAGNOSIS — Z09 Encounter for follow-up examination after completed treatment for conditions other than malignant neoplasm: Secondary | ICD-10-CM

## 2012-05-16 NOTE — Patient Instructions (Signed)
We will see you again on an as needed basis. Please call the office at [phone] if you have any questions or concerns. Thank you for allowing us to take care of you.  

## 2012-05-16 NOTE — Progress Notes (Signed)
NAME: Cassandra Lang                                            DOB: Nov 01, 1924 DATE: 05/16/2012                                                  MRN: [id number]  CC:  Chief Complaint  Patient presents with  . Routine Post Op    Left breast biopsy 05/01/12    HPI: This patient comes in for post op follow-up .Sheunderwent LEFT BREAST BIOPSY AND NIPPLE BIOPSY on 05/01/12. She feels that she is doing well.  PE:  VITAL SIGNS: BP 116/72  Pulse 78  Resp 16  Ht 5' 3.5" (1.613 m)  Wt 155 lb (70.308 kg)  BMI 27.03 kg/m2  General: The patient appears to be healthy, NAD Incision: Healing nicely, no evidence of infection or other problem  DATA REVIEWED: Path: 1. Breast, biopsy, Left - BENIGN SKIN WITH SEPARATE FRAGMENTS OF BENIGN BREAST PARENCHYMA SHOWING BENIGN EPITHELIAL CYST FORMATION CONSISTENT WITH FIBROCYSTIC CHANGES. - NO ATYPIA, HYPERPLASIA OR MALIGNANCY IDENTIFIED. 2. Breast, lumpectomy, Left - BENIGN BREAST PARENCHYMA WITH FIBROCYSTIC CHANGES AND USUAL DUCTAL HYPERPLASIA. - BIOPSY SITE CHANGES IDENTIFIED. - FIBROSIS, FAT NECROSIS AND HEMOSIDERIN LADEN MACROPHAGES PRESENT. - NO ATYPIA OR MALIGNANCY IDENTIFIED. - SEE COMMENT.  IMPRESSION: The patient is doing well S/P left breast biopsy.    PLAN: RTC PRN. Gave her a copy of the path and reviewed it with her

## 2012-06-07 ENCOUNTER — Other Ambulatory Visit: Payer: Self-pay | Admitting: Internal Medicine

## 2012-06-28 DIAGNOSIS — H15009 Unspecified scleritis, unspecified eye: Secondary | ICD-10-CM | POA: Diagnosis not present

## 2012-07-06 ENCOUNTER — Ambulatory Visit
Admission: RE | Admit: 2012-07-06 | Discharge: 2012-07-06 | Disposition: A | Discharge status: Home / Self Care | Payer: Medicare Other | Source: Ambulatory Visit | Attending: Internal Medicine | Admitting: Internal Medicine

## 2012-07-06 DIAGNOSIS — Z1231 Encounter for screening mammogram for malignant neoplasm of breast: Secondary | ICD-10-CM | POA: Diagnosis not present

## 2012-08-07 DIAGNOSIS — I10 Essential (primary) hypertension: Secondary | ICD-10-CM | POA: Diagnosis not present

## 2012-08-07 DIAGNOSIS — M899 Disorder of bone, unspecified: Secondary | ICD-10-CM | POA: Diagnosis not present

## 2012-08-07 DIAGNOSIS — E785 Hyperlipidemia, unspecified: Secondary | ICD-10-CM | POA: Diagnosis not present

## 2012-08-07 DIAGNOSIS — M949 Disorder of cartilage, unspecified: Secondary | ICD-10-CM | POA: Diagnosis not present

## 2012-08-11 DIAGNOSIS — Z1212 Encounter for screening for malignant neoplasm of rectum: Secondary | ICD-10-CM | POA: Diagnosis not present

## 2012-08-14 DIAGNOSIS — I10 Essential (primary) hypertension: Secondary | ICD-10-CM | POA: Diagnosis not present

## 2012-08-14 DIAGNOSIS — M949 Disorder of cartilage, unspecified: Secondary | ICD-10-CM | POA: Diagnosis not present

## 2012-08-14 DIAGNOSIS — Z1331 Encounter for screening for depression: Secondary | ICD-10-CM | POA: Diagnosis not present

## 2012-08-14 DIAGNOSIS — K219 Gastro-esophageal reflux disease without esophagitis: Secondary | ICD-10-CM | POA: Diagnosis not present

## 2012-08-14 DIAGNOSIS — M899 Disorder of bone, unspecified: Secondary | ICD-10-CM | POA: Diagnosis not present

## 2012-08-14 DIAGNOSIS — C50919 Malignant neoplasm of unspecified site of unspecified female breast: Secondary | ICD-10-CM | POA: Diagnosis not present

## 2012-08-14 DIAGNOSIS — Z0000 Encounter for general adult medical examination without abnormal findings: Secondary | ICD-10-CM | POA: Diagnosis not present

## 2012-08-14 DIAGNOSIS — C5700 Malignant neoplasm of unspecified fallopian tube: Secondary | ICD-10-CM | POA: Diagnosis not present

## 2012-08-14 DIAGNOSIS — E785 Hyperlipidemia, unspecified: Secondary | ICD-10-CM | POA: Diagnosis not present

## 2012-10-10 DIAGNOSIS — D485 Neoplasm of uncertain behavior of skin: Secondary | ICD-10-CM | POA: Diagnosis not present

## 2012-10-13 ENCOUNTER — Other Ambulatory Visit: Payer: Self-pay | Admitting: *Deleted

## 2012-10-16 MED ORDER — VALSARTAN 80 MG PO TABS: 80.0000 mg | ORAL_TABLET | Freq: Every day | ORAL | Status: DC

## 2012-10-16 NOTE — Telephone Encounter (Signed)
Last office visit with Dr.Jordan 01/06/12, in recalls for appointment with Dr.Jordan 9/14.

## 2012-11-23 ENCOUNTER — Other Ambulatory Visit: Payer: Self-pay | Admitting: Cardiology

## 2013-01-03 DIAGNOSIS — K219 Gastro-esophageal reflux disease without esophagitis: Secondary | ICD-10-CM | POA: Diagnosis not present

## 2013-01-03 DIAGNOSIS — R300 Dysuria: Secondary | ICD-10-CM | POA: Diagnosis not present

## 2013-01-03 DIAGNOSIS — Z6827 Body mass index (BMI) 27.0-27.9, adult: Secondary | ICD-10-CM | POA: Diagnosis not present

## 2013-01-03 DIAGNOSIS — I10 Essential (primary) hypertension: Secondary | ICD-10-CM | POA: Diagnosis not present

## 2013-01-03 DIAGNOSIS — Z23 Encounter for immunization: Secondary | ICD-10-CM | POA: Diagnosis not present

## 2013-01-30 ENCOUNTER — Encounter: Payer: Self-pay | Admitting: Cardiology

## 2013-01-30 ENCOUNTER — Ambulatory Visit (INDEPENDENT_AMBULATORY_CARE_PROVIDER_SITE_OTHER): Payer: Medicare Other | Admitting: Cardiology

## 2013-01-30 VITALS — BP 142/68 | HR 71 | Ht 63.0 in | Wt 156.0 lb

## 2013-01-30 DIAGNOSIS — R002 Palpitations: Secondary | ICD-10-CM | POA: Diagnosis not present

## 2013-01-30 DIAGNOSIS — I10 Essential (primary) hypertension: Secondary | ICD-10-CM | POA: Diagnosis not present

## 2013-01-30 DIAGNOSIS — I491 Atrial premature depolarization: Secondary | ICD-10-CM | POA: Diagnosis not present

## 2013-01-30 NOTE — Patient Instructions (Signed)
Continue your current therapy  Try and avoid caffeine  I will see you in one year.

## 2013-01-30 NOTE — Progress Notes (Signed)
Cassandra Lang Date of Birth: Apr 10, 1925   History of Present Illness: [name] is seen for followup today. She has a history of PACs, hypertension, and hyperlipidemia. She reports she is doing very well. She still notes occasional skipping of her heart at times. She drinks a moderate amount of caffeine. She is still trying to adjust from her husband's absence. She is very active.  Current Outpatient Prescriptions on File Prior to Visit  Medication Sig Dispense Refill  . acetaminophen (TYLENOL) 325 MG tablet Take 650 mg by mouth every 6 (six) hours as needed.        Marland Kitchen aspirin 81 MG tablet Take 81 mg by mouth daily.        Marland Kitchen atorvastatin (LIPITOR) 10 MG tablet Take 10 mg by mouth daily.        . Calcium Carbonate-Vitamin D (CALCIUM + D PO) Take by mouth 2 (two) times daily.        . DiphenhydrAMINE HCl (BENADRYL ALLERGY PO) Take by mouth as needed.        . hydrochlorothiazide (HYDRODIURIL) 25 MG tablet Take 1 tablet (25 mg total) by mouth daily.  30 tablet  6  . metoprolol tartrate (LOPRESSOR) 25 MG tablet Take 25 mg by mouth 2 (two) times daily.      . Multiple Vitamins-Minerals (MULTIVITAMIN PO) Take by mouth daily.      . valsartan (DIOVAN) 80 MG tablet Take 1 tablet (80 mg total) by mouth daily.  30 tablet  6   No current facility-administered medications on file prior to visit.    Allergies  Allergen Reactions  . Penicillins     Past Medical History  Diagnosis Date  . Hypertension   . Hyperlipidemia   . Breast cancer   . Palpitations   . Arthritis   . Dysrhythmia     pac  . Colitis   . Breast mass, left 03/21/2012    Benign on excisional biopsy 05/01/12     Past Surgical History  Procedure Laterality Date  . Tumor removal      FROM FALLOPIAN TUBE  . Abdominal hysterectomy    . Mastectomy partial / lumpectomy  1999    rt lump-rt axillary dissection-#20  . Tonsillectomy    . Appendectomy    . Cardiac catheterization      rt eye-cataract  . Colonoscopy    . Breast  biopsy  05/01/2012    Procedure: BREAST BIOPSY WITH NEEDLE LOCALIZATION;  Surgeon: Currie Paris, MD;  Location: [facility];  Service: General;  Laterality: Left;  needle localization left breast biopsy and left nipple biopsy    History  Smoking status  . Never Smoker   Smokeless tobacco  . Not on file    History  Alcohol Use No    Family History  Problem Relation Age of Onset  . Kidney disease Father     Review of Systems:   All other systems were reviewed and are negative.  Physical Exam: BP 142/68  Pulse 71  Ht 5\' 3"  (1.6 m)  Wt 156 lb (70.761 kg)  BMI 27.64 kg/m2 She is a pleasant elderly female in no distress. Her HEENT exam is unremarkable. She has no JVD or bruits. Lungs are clear. Cardiac exam reveals a regular rate and rhythm without gallop, murmur, or click. Abdomen is soft and nontender. She has no masses or bruits. Pulses are good throughout. She has no edema. Neurologic exam is nonfocal. LABORATORY DATA: ECG today  is normal.   Assessment / Plan: 1. PACs. Well controlled on beta blocker therapy. We will plan followup again in one year or as needed. I have encouraged her to limit her caffeine intake.  2. Hypertension, controlled.

## 2013-01-31 ENCOUNTER — Encounter: Payer: Self-pay | Admitting: Cardiology

## 2013-03-01 ENCOUNTER — Ambulatory Visit: Payer: Medicare Other | Admitting: Cardiology

## 2013-04-18 DIAGNOSIS — H52209 Unspecified astigmatism, unspecified eye: Secondary | ICD-10-CM | POA: Diagnosis not present

## 2013-04-18 DIAGNOSIS — Z961 Presence of intraocular lens: Secondary | ICD-10-CM | POA: Diagnosis not present

## 2013-04-18 DIAGNOSIS — H2510 Age-related nuclear cataract, unspecified eye: Secondary | ICD-10-CM | POA: Diagnosis not present

## 2013-05-09 ENCOUNTER — Other Ambulatory Visit: Payer: Self-pay

## 2013-05-09 MED ORDER — VALSARTAN 80 MG PO TABS: 80.0000 mg | ORAL_TABLET | Freq: Every day | ORAL | Status: DC

## 2013-05-28 ENCOUNTER — Other Ambulatory Visit: Payer: Self-pay

## 2013-05-28 DIAGNOSIS — Z1231 Encounter for screening mammogram for malignant neoplasm of breast: Secondary | ICD-10-CM

## 2013-07-09 ENCOUNTER — Ambulatory Visit
Admission: RE | Admit: 2013-07-09 | Discharge: 2013-07-09 | Disposition: A | Discharge status: Home / Self Care | Payer: Medicare Other | Source: Ambulatory Visit

## 2013-07-09 ENCOUNTER — Ambulatory Visit: Payer: Medicare Other

## 2013-07-09 DIAGNOSIS — Z1231 Encounter for screening mammogram for malignant neoplasm of breast: Secondary | ICD-10-CM

## 2013-08-09 DIAGNOSIS — M949 Disorder of cartilage, unspecified: Secondary | ICD-10-CM | POA: Diagnosis not present

## 2013-08-09 DIAGNOSIS — M899 Disorder of bone, unspecified: Secondary | ICD-10-CM | POA: Diagnosis not present

## 2013-08-09 DIAGNOSIS — I10 Essential (primary) hypertension: Secondary | ICD-10-CM | POA: Diagnosis not present

## 2013-08-09 DIAGNOSIS — E785 Hyperlipidemia, unspecified: Secondary | ICD-10-CM | POA: Diagnosis not present

## 2013-08-15 DIAGNOSIS — N183 Chronic kidney disease, stage 3 unspecified: Secondary | ICD-10-CM | POA: Diagnosis not present

## 2013-08-15 DIAGNOSIS — Z0000 Encounter for general adult medical examination without abnormal findings: Secondary | ICD-10-CM | POA: Diagnosis not present

## 2013-08-15 DIAGNOSIS — M949 Disorder of cartilage, unspecified: Secondary | ICD-10-CM | POA: Diagnosis not present

## 2013-08-15 DIAGNOSIS — I10 Essential (primary) hypertension: Secondary | ICD-10-CM | POA: Diagnosis not present

## 2013-08-15 DIAGNOSIS — E785 Hyperlipidemia, unspecified: Secondary | ICD-10-CM | POA: Diagnosis not present

## 2013-08-15 DIAGNOSIS — M899 Disorder of bone, unspecified: Secondary | ICD-10-CM | POA: Diagnosis not present

## 2013-08-15 DIAGNOSIS — C50919 Malignant neoplasm of unspecified site of unspecified female breast: Secondary | ICD-10-CM | POA: Diagnosis not present

## 2013-08-15 DIAGNOSIS — K219 Gastro-esophageal reflux disease without esophagitis: Secondary | ICD-10-CM | POA: Diagnosis not present

## 2013-08-15 DIAGNOSIS — K559 Vascular disorder of intestine, unspecified: Secondary | ICD-10-CM | POA: Diagnosis not present

## 2013-08-16 DIAGNOSIS — L259 Unspecified contact dermatitis, unspecified cause: Secondary | ICD-10-CM | POA: Diagnosis not present

## 2013-08-16 DIAGNOSIS — D485 Neoplasm of uncertain behavior of skin: Secondary | ICD-10-CM | POA: Diagnosis not present

## 2013-08-20 DIAGNOSIS — Z1212 Encounter for screening for malignant neoplasm of rectum: Secondary | ICD-10-CM | POA: Diagnosis not present

## 2013-09-13 DIAGNOSIS — Z23 Encounter for immunization: Secondary | ICD-10-CM | POA: Diagnosis not present

## 2013-09-14 ENCOUNTER — Other Ambulatory Visit: Payer: Self-pay

## 2013-09-14 MED ORDER — VALSARTAN 80 MG PO TABS: 80.0000 mg | ORAL_TABLET | Freq: Every day | ORAL | Status: DC

## 2013-09-14 MED ORDER — HYDROCHLOROTHIAZIDE 25 MG PO TABS: 25.0000 mg | ORAL_TABLET | Freq: Every day | ORAL | Status: DC

## 2013-09-25 DIAGNOSIS — M899 Disorder of bone, unspecified: Secondary | ICD-10-CM | POA: Diagnosis not present

## 2013-09-25 DIAGNOSIS — M949 Disorder of cartilage, unspecified: Secondary | ICD-10-CM | POA: Diagnosis not present

## 2013-11-16 DIAGNOSIS — Z6827 Body mass index (BMI) 27.0-27.9, adult: Secondary | ICD-10-CM | POA: Diagnosis not present

## 2013-11-16 DIAGNOSIS — J309 Allergic rhinitis, unspecified: Secondary | ICD-10-CM | POA: Diagnosis not present

## 2013-11-16 DIAGNOSIS — H9209 Otalgia, unspecified ear: Secondary | ICD-10-CM | POA: Diagnosis not present

## 2014-01-14 DIAGNOSIS — Z23 Encounter for immunization: Secondary | ICD-10-CM | POA: Diagnosis not present

## 2014-02-06 ENCOUNTER — Other Ambulatory Visit: Payer: Self-pay | Admitting: *Deleted

## 2014-02-06 ENCOUNTER — Ambulatory Visit (INDEPENDENT_AMBULATORY_CARE_PROVIDER_SITE_OTHER): Payer: Medicare Other | Admitting: Cardiology

## 2014-02-06 ENCOUNTER — Encounter: Payer: Self-pay | Admitting: Cardiology

## 2014-02-06 VITALS — BP 110/50 | HR 65 | Ht 63.0 in | Wt 154.3 lb

## 2014-02-06 DIAGNOSIS — I10 Essential (primary) hypertension: Secondary | ICD-10-CM

## 2014-02-06 DIAGNOSIS — I491 Atrial premature depolarization: Secondary | ICD-10-CM | POA: Diagnosis not present

## 2014-02-06 DIAGNOSIS — E785 Hyperlipidemia, unspecified: Secondary | ICD-10-CM

## 2014-02-06 NOTE — Patient Instructions (Signed)
We will call and get a copy of your lab work from Dr. Brigitte Pulse  Stay active  Continue your current therapy  I will see you in one year

## 2014-02-06 NOTE — Progress Notes (Signed)
Cassandra Lang Date of Birth: 1924-12-09   History of Present Illness: Cassandra Lang is seen for yearly followup today. She has a history of PACs, hypertension, and hyperlipidemia. She notes sometimes she gets SOB when pulling her trash cans uphill but otherwise denies any chest pain or palpitations.  She is very active.  Current Outpatient Prescriptions on File Prior to Visit  Medication Sig Dispense Refill  . acetaminophen (TYLENOL) 325 MG tablet Take 650 mg by mouth every 6 (six) hours as needed.        Marland Kitchen aspirin 81 MG tablet Take 81 mg by mouth daily.        Marland Kitchen atorvastatin (LIPITOR) 10 MG tablet Take 10 mg by mouth daily.        . Calcium Carbonate-Vitamin D (CALCIUM + D PO) Take by mouth 2 (two) times daily.        . DiphenhydrAMINE HCl (BENADRYL ALLERGY PO) Take by mouth as needed.        . hydrochlorothiazide (HYDRODIURIL) 25 MG tablet Take 1 tablet (25 mg total) by mouth daily.  90 tablet  1  . metoprolol tartrate (LOPRESSOR) 25 MG tablet Take 25 mg by mouth 2 (two) times daily.      . Multiple Vitamins-Minerals (MULTIVITAMIN PO) Take by mouth daily.      . valsartan (DIOVAN) 80 MG tablet Take 1 tablet (80 mg total) by mouth daily.  90 tablet  1   No current facility-administered medications on file prior to visit.    Allergies  Allergen Reactions  . Penicillins     Past Medical History  Diagnosis Date  . Hypertension   . Hyperlipidemia   . Breast cancer   . Palpitations   . Arthritis   . Dysrhythmia     pac  . Colitis   . Breast mass, left 03/21/2012    Benign on excisional biopsy 05/01/12     Past Surgical History  Procedure Laterality Date  . Tumor removal      FROM FALLOPIAN TUBE  . Abdominal hysterectomy    . Mastectomy partial / lumpectomy  1999    rt lump-rt axillary dissection-#20  . Tonsillectomy    . Appendectomy    . Cardiac catheterization      rt eye-cataract  . Colonoscopy    . Breast biopsy  05/01/2012    Procedure: BREAST BIOPSY WITH NEEDLE  LOCALIZATION;  Surgeon: Haywood Lasso, MD;  Location: [facility];  Service: General;  Laterality: Left;  needle localization left breast biopsy and left nipple biopsy    History  Smoking status  . Never Smoker   Smokeless tobacco  . Not on file    History  Alcohol Use No    Family History  Problem Relation Age of Onset  . Kidney disease Father     Review of Systems:   All other systems were reviewed and are negative.  Physical Exam: BP 110/50  Pulse 65  Ht 5\' 3"  (1.6 m)  Wt 154 lb 4.8 oz (69.99 kg)  BMI 27.34 kg/m2 She is a pleasant elderly female in no distress. Her HEENT exam is unremarkable. She has no JVD or bruits. Lungs are clear. Cardiac exam reveals a regular rate and rhythm without gallop, murmur, or click. Abdomen is soft and nontender. She has no masses or bruits. Pulses are good throughout. She has no edema. Neurologic exam is nonfocal.  LABORATORY DATA: ECG today is normal. Rate 65 bpm  Assessment / Plan:  1. PACs. Well controlled on beta blocker therapy.  I have encouraged her to limit her caffeine intake.  2. Hypertension, controlled.  3. Hyperlipidemia per primary care.

## 2014-03-01 DIAGNOSIS — M7061 Trochanteric bursitis, right hip: Secondary | ICD-10-CM | POA: Diagnosis not present

## 2014-04-17 ENCOUNTER — Other Ambulatory Visit: Payer: Self-pay | Admitting: Cardiology

## 2014-04-17 MED ORDER — HYDROCHLOROTHIAZIDE 25 MG PO TABS: 25.0000 mg | ORAL_TABLET | Freq: Every day | ORAL | Status: DC

## 2014-04-22 DIAGNOSIS — H52203 Unspecified astigmatism, bilateral: Secondary | ICD-10-CM | POA: Diagnosis not present

## 2014-04-22 DIAGNOSIS — H2512 Age-related nuclear cataract, left eye: Secondary | ICD-10-CM | POA: Diagnosis not present

## 2014-05-23 DIAGNOSIS — M4807 Spinal stenosis, lumbosacral region: Secondary | ICD-10-CM | POA: Diagnosis not present

## 2014-05-23 DIAGNOSIS — M1612 Unilateral primary osteoarthritis, left hip: Secondary | ICD-10-CM | POA: Diagnosis not present

## 2014-06-03 ENCOUNTER — Other Ambulatory Visit: Payer: Self-pay | Admitting: Nurse Practitioner

## 2014-06-03 MED ORDER — VALSARTAN 80 MG PO TABS: 80.0000 mg | ORAL_TABLET | Freq: Every day | ORAL | Status: DC

## 2014-06-04 ENCOUNTER — Other Ambulatory Visit: Payer: Self-pay

## 2014-06-04 DIAGNOSIS — Z1231 Encounter for screening mammogram for malignant neoplasm of breast: Secondary | ICD-10-CM

## 2014-06-13 DIAGNOSIS — M7072 Other bursitis of hip, left hip: Secondary | ICD-10-CM | POA: Diagnosis not present

## 2014-06-13 DIAGNOSIS — M1612 Unilateral primary osteoarthritis, left hip: Secondary | ICD-10-CM | POA: Diagnosis not present

## 2014-06-13 DIAGNOSIS — M7071 Other bursitis of hip, right hip: Secondary | ICD-10-CM | POA: Diagnosis not present

## 2014-06-13 DIAGNOSIS — M4807 Spinal stenosis, lumbosacral region: Secondary | ICD-10-CM | POA: Diagnosis not present

## 2014-06-19 DIAGNOSIS — L820 Inflamed seborrheic keratosis: Secondary | ICD-10-CM | POA: Diagnosis not present

## 2014-06-19 DIAGNOSIS — L309 Dermatitis, unspecified: Secondary | ICD-10-CM | POA: Diagnosis not present

## 2014-06-19 DIAGNOSIS — L570 Actinic keratosis: Secondary | ICD-10-CM | POA: Diagnosis not present

## 2014-07-16 ENCOUNTER — Ambulatory Visit
Admission: RE | Admit: 2014-07-16 | Discharge: 2014-07-16 | Disposition: A | Discharge status: Home / Self Care | Payer: Medicare Other | Source: Ambulatory Visit

## 2014-07-16 DIAGNOSIS — Z1231 Encounter for screening mammogram for malignant neoplasm of breast: Secondary | ICD-10-CM | POA: Diagnosis not present

## 2014-08-15 DIAGNOSIS — M859 Disorder of bone density and structure, unspecified: Secondary | ICD-10-CM | POA: Diagnosis not present

## 2014-08-15 DIAGNOSIS — Z0000 Encounter for general adult medical examination without abnormal findings: Secondary | ICD-10-CM | POA: Diagnosis not present

## 2014-08-15 DIAGNOSIS — I10 Essential (primary) hypertension: Secondary | ICD-10-CM | POA: Diagnosis not present

## 2014-08-15 DIAGNOSIS — E785 Hyperlipidemia, unspecified: Secondary | ICD-10-CM | POA: Diagnosis not present

## 2014-08-22 DIAGNOSIS — Z1389 Encounter for screening for other disorder: Secondary | ICD-10-CM | POA: Diagnosis not present

## 2014-08-22 DIAGNOSIS — C5700 Malignant neoplasm of unspecified fallopian tube: Secondary | ICD-10-CM | POA: Diagnosis not present

## 2014-08-22 DIAGNOSIS — Z0000 Encounter for general adult medical examination without abnormal findings: Secondary | ICD-10-CM | POA: Diagnosis not present

## 2014-08-22 DIAGNOSIS — I10 Essential (primary) hypertension: Secondary | ICD-10-CM | POA: Diagnosis not present

## 2014-08-22 DIAGNOSIS — E785 Hyperlipidemia, unspecified: Secondary | ICD-10-CM | POA: Diagnosis not present

## 2014-08-22 DIAGNOSIS — Z6827 Body mass index (BMI) 27.0-27.9, adult: Secondary | ICD-10-CM | POA: Diagnosis not present

## 2014-08-22 DIAGNOSIS — K589 Irritable bowel syndrome without diarrhea: Secondary | ICD-10-CM | POA: Diagnosis not present

## 2014-08-22 DIAGNOSIS — R002 Palpitations: Secondary | ICD-10-CM | POA: Diagnosis not present

## 2014-08-22 DIAGNOSIS — C50919 Malignant neoplasm of unspecified site of unspecified female breast: Secondary | ICD-10-CM | POA: Diagnosis not present

## 2014-08-22 DIAGNOSIS — M8580 Other specified disorders of bone density and structure, unspecified site: Secondary | ICD-10-CM | POA: Diagnosis not present

## 2014-08-22 DIAGNOSIS — K219 Gastro-esophageal reflux disease without esophagitis: Secondary | ICD-10-CM | POA: Diagnosis not present

## 2014-08-22 DIAGNOSIS — N183 Chronic kidney disease, stage 3 (moderate): Secondary | ICD-10-CM | POA: Diagnosis not present

## 2014-08-26 DIAGNOSIS — Z1212 Encounter for screening for malignant neoplasm of rectum: Secondary | ICD-10-CM | POA: Diagnosis not present

## 2014-10-17 ENCOUNTER — Other Ambulatory Visit: Payer: Self-pay

## 2014-10-17 DIAGNOSIS — I8310 Varicose veins of unspecified lower extremity with inflammation: Secondary | ICD-10-CM | POA: Diagnosis not present

## 2014-10-17 DIAGNOSIS — M7070 Other bursitis of hip, unspecified hip: Secondary | ICD-10-CM | POA: Diagnosis not present

## 2014-10-17 DIAGNOSIS — I10 Essential (primary) hypertension: Secondary | ICD-10-CM | POA: Diagnosis not present

## 2014-10-17 DIAGNOSIS — Z6827 Body mass index (BMI) 27.0-27.9, adult: Secondary | ICD-10-CM | POA: Diagnosis not present

## 2014-10-17 DIAGNOSIS — M5432 Sciatica, left side: Secondary | ICD-10-CM | POA: Diagnosis not present

## 2014-10-17 MED ORDER — HYDROCHLOROTHIAZIDE 25 MG PO TABS: 25.0000 mg | ORAL_TABLET | Freq: Every day | ORAL | Status: DC

## 2014-12-13 ENCOUNTER — Telehealth: Payer: Self-pay | Admitting: Cardiology

## 2014-12-13 NOTE — Telephone Encounter (Signed)
Received a call from patient she stated she forgot to take pm dose of metoprolol last night.Stated during night she woke up with heart beating fast and irregular.Stated lasted appox 1 hour.Stated she felt ok.This morning she feels weak and shaky.Stated heart in rhythm.Advised I will speak to Dr.Jordan and call you back.

## 2014-12-13 NOTE — Telephone Encounter (Signed)
Returned call to patient spoke to Cassandra Lang he advised continue current medications.Advised may take a extra metoprolol 25 mg if needed,try not to forget to take.Appointment scheduled with Dr.Jordan 12/26/14 at 4:30 pm.Advised to call sooner if needed.Advised to go to ER if needed.

## 2014-12-26 ENCOUNTER — Encounter: Payer: Self-pay | Admitting: Cardiology

## 2014-12-26 ENCOUNTER — Ambulatory Visit (INDEPENDENT_AMBULATORY_CARE_PROVIDER_SITE_OTHER): Payer: Medicare Other | Admitting: Cardiology

## 2014-12-26 VITALS — BP 140/56 | HR 68 | Ht 63.75 in | Wt 154.4 lb

## 2014-12-26 DIAGNOSIS — I491 Atrial premature depolarization: Secondary | ICD-10-CM

## 2014-12-26 DIAGNOSIS — E785 Hyperlipidemia, unspecified: Secondary | ICD-10-CM

## 2014-12-26 DIAGNOSIS — I10 Essential (primary) hypertension: Secondary | ICD-10-CM

## 2014-12-26 DIAGNOSIS — R002 Palpitations: Secondary | ICD-10-CM

## 2014-12-26 NOTE — Patient Instructions (Signed)
We will have you where an event monitor  Continue your current therapy

## 2014-12-26 NOTE — Progress Notes (Signed)
Cassandra Lang Date of Birth: Jan 26, 1925   History of Present Illness: Cassandra Lang is seen as a work in for evaluation of palpitations. She has a history of PACs, hypertension, and hyperlipidemia. Last Holter in 2012 showed frequent PACs but no other arrhythmia. She complains of SOB when pulling her trash cans uphill but otherwise denies any chest pain or palpitations.  She is active. Recently she has noted problems with insomnia. This past week she awoke from sleep with a feeling that something wasn't right. She felt her heart racing and beating irregular. It lasted for 1.5 hours then resolved. She has not felt it this bad before. No dizziness.  Current Outpatient Prescriptions on File Prior to Visit  Medication Sig Dispense Refill  . acetaminophen (TYLENOL) 325 MG tablet Take 650 mg by mouth every 6 (six) hours as needed.      Marland Kitchen aspirin 81 MG tablet Take 81 mg by mouth daily.      Marland Kitchen atorvastatin (LIPITOR) 10 MG tablet Take 10 mg by mouth daily.      . Calcium Carbonate-Vitamin D (CALCIUM + D PO) Take 1 tablet by mouth 2 (two) times daily.     . DiphenhydrAMINE HCl (BENADRYL ALLERGY PO) Take by mouth as needed.      . fluticasone (FLONASE) 50 MCG/ACT nasal spray Place 2 sprays into both nostrils daily.    . hydrochlorothiazide (HYDRODIURIL) 25 MG tablet Take 1 tablet (25 mg total) by mouth daily. 90 tablet 1  . metoprolol tartrate (LOPRESSOR) 25 MG tablet Take 25 mg by mouth 2 (two) times daily.    . Multiple Vitamins-Minerals (MULTIVITAMIN PO) Take by mouth daily.    . valsartan (DIOVAN) 80 MG tablet Take 1 tablet (80 mg total) by mouth daily. 90 tablet 2   No current facility-administered medications on file prior to visit.    Allergies  Allergen Reactions  . Penicillins     Past Medical History  Diagnosis Date  . Hypertension   . Hyperlipidemia   . Breast cancer   . Palpitations   . Arthritis   . Dysrhythmia     pac  . Colitis   . Breast mass, left 03/21/2012    Benign on  excisional biopsy 05/01/12     Past Surgical History  Procedure Laterality Date  . Tumor removal      FROM FALLOPIAN TUBE  . Abdominal hysterectomy    . Mastectomy partial / lumpectomy  1999    rt lump-rt axillary dissection-#20  . Tonsillectomy    . Appendectomy    . Cardiac catheterization      rt eye-cataract  . Colonoscopy    . Breast biopsy  05/01/2012    Procedure: BREAST BIOPSY WITH NEEDLE LOCALIZATION;  Surgeon: Haywood Lasso, MD;  Location: [facility];  Service: General;  Laterality: Left;  needle localization left breast biopsy and left nipple biopsy    History  Smoking status  . Never Smoker   Smokeless tobacco  . Not on file    History  Alcohol Use No    Family History  Problem Relation Age of Onset  . Kidney disease Father     Review of Systems:   All other systems were reviewed and are negative.  Physical Exam: BP 140/56 mmHg  Pulse 68  Ht 5' 3.75" (1.619 m)  Wt 70.035 kg (154 lb 6.4 oz)  BMI 26.72 kg/m2 She is a pleasant elderly female in no distress. Her HEENT exam is unremarkable.  She has no JVD or bruits. Lungs are clear. Cardiac exam reveals a regular rate and rhythm without gallop, murmur, or click. Abdomen is soft and nontender. She has no masses or bruits. Pulses are good throughout. She has no edema. Neurologic exam is nonfocal.  LABORATORY DATA: ECG today is normal. Rate 68 bpm. I have personally reviewed and interpreted this study.   Assessment / Plan: 1. Palpitations. Recent symptoms are concerning for paroxysmal Afib. She has known PACs. Well controlled on beta blocker therapy.  I have recommended an event monitor to see if she is having intermittent atrial fibrillation since this would change her therapy and we would need to consider for anti-coagulation. A Holter is inadequate since her symptoms are sporadic.  2. Hypertension, controlled.  3. Hyperlipidemia per primary care.

## 2014-12-27 ENCOUNTER — Telehealth: Payer: Self-pay | Admitting: Cardiology

## 2014-12-27 NOTE — Telephone Encounter (Signed)
Left message for patient to call. She needs to schedule an event monitor.

## 2015-01-02 ENCOUNTER — Ambulatory Visit (INDEPENDENT_AMBULATORY_CARE_PROVIDER_SITE_OTHER): Payer: Medicare Other

## 2015-01-02 DIAGNOSIS — E785 Hyperlipidemia, unspecified: Secondary | ICD-10-CM

## 2015-01-02 DIAGNOSIS — R002 Palpitations: Secondary | ICD-10-CM | POA: Diagnosis not present

## 2015-01-02 DIAGNOSIS — I491 Atrial premature depolarization: Secondary | ICD-10-CM | POA: Diagnosis not present

## 2015-01-02 DIAGNOSIS — I10 Essential (primary) hypertension: Secondary | ICD-10-CM

## 2015-01-11 DIAGNOSIS — Z23 Encounter for immunization: Secondary | ICD-10-CM | POA: Diagnosis not present

## 2015-02-03 ENCOUNTER — Telehealth: Payer: Self-pay | Admitting: Cardiology

## 2015-02-03 NOTE — Telephone Encounter (Signed)
Received a call from patient.She was calling to see if she needs to keep appointment 02/06/15 with Dr.Jordan.Stated she just saw Dr.Jordan and was told to follow up in 6 months.02/06/15 appointment cancelled and advised next appointment is in 06/2015 with Dr.Jordan.Advised to call sooner if needed.

## 2015-02-06 ENCOUNTER — Ambulatory Visit: Payer: Medicare Other | Admitting: Cardiology

## 2015-02-27 ENCOUNTER — Other Ambulatory Visit: Payer: Self-pay | Admitting: Nurse Practitioner

## 2015-04-15 ENCOUNTER — Other Ambulatory Visit: Payer: Self-pay

## 2015-04-15 MED ORDER — HYDROCHLOROTHIAZIDE 25 MG PO TABS: 25.0000 mg | ORAL_TABLET | Freq: Every day | ORAL | Status: AC

## 2015-04-23 DIAGNOSIS — H2512 Age-related nuclear cataract, left eye: Secondary | ICD-10-CM | POA: Diagnosis not present

## 2015-04-23 DIAGNOSIS — H52203 Unspecified astigmatism, bilateral: Secondary | ICD-10-CM | POA: Diagnosis not present

## 2015-05-22 DIAGNOSIS — H2512 Age-related nuclear cataract, left eye: Secondary | ICD-10-CM | POA: Diagnosis not present

## 2015-05-22 DIAGNOSIS — H25812 Combined forms of age-related cataract, left eye: Secondary | ICD-10-CM | POA: Diagnosis not present

## 2015-06-05 ENCOUNTER — Other Ambulatory Visit: Payer: Self-pay

## 2015-06-05 DIAGNOSIS — Z1231 Encounter for screening mammogram for malignant neoplasm of breast: Secondary | ICD-10-CM

## 2015-06-24 ENCOUNTER — Encounter: Payer: Self-pay | Admitting: Cardiology

## 2015-06-24 ENCOUNTER — Ambulatory Visit (INDEPENDENT_AMBULATORY_CARE_PROVIDER_SITE_OTHER): Payer: Medicare Other | Admitting: Cardiology

## 2015-06-24 VITALS — BP 120/58 | HR 74 | Ht 63.0 in | Wt 151.3 lb

## 2015-06-24 DIAGNOSIS — R002 Palpitations: Secondary | ICD-10-CM

## 2015-06-24 DIAGNOSIS — I10 Essential (primary) hypertension: Secondary | ICD-10-CM | POA: Diagnosis not present

## 2015-06-24 DIAGNOSIS — E785 Hyperlipidemia, unspecified: Secondary | ICD-10-CM

## 2015-06-24 NOTE — Progress Notes (Signed)
Cassandra Lang Date of Birth: [birth year]   History of Present Illness: Cassandra Lang is seen for follow up palpitations.  She has a history of PACs, hypertension, and hyperlipidemia. Last Holter in 2012 showed frequent PACs but no other arrhythmia. When last seen she complains of episodes of heart racing and irregularity lasting up to 1.5 hours. An event monitor was placed and was normal. She has no recurrent symptoms. Denies any chest pain or SOB. Overall feels well.   Current Outpatient Prescriptions on File Prior to Visit  Medication Sig Dispense Refill  . acetaminophen (TYLENOL) 325 MG tablet Take 650 mg by mouth every 6 (six) hours as needed.      Marland Kitchen aspirin 81 MG tablet Take 81 mg by mouth daily.      Marland Kitchen atorvastatin (LIPITOR) 10 MG tablet Take 10 mg by mouth daily.      . Calcium Carbonate-Vitamin D (CALCIUM + D PO) Take 1 tablet by mouth 2 (two) times daily.     . DiphenhydrAMINE HCl (BENADRYL ALLERGY PO) Take by mouth as needed.      . fluticasone (FLONASE) 50 MCG/ACT nasal spray Place 2 sprays into both nostrils daily.    . hydrochlorothiazide (HYDRODIURIL) 25 MG tablet Take 1 tablet (25 mg total) by mouth daily. 90 tablet 3  . metoprolol tartrate (LOPRESSOR) 25 MG tablet Take 25 mg by mouth 2 (two) times daily.    . Multiple Vitamins-Minerals (MULTIVITAMIN PO) Take by mouth daily.    . valsartan (DIOVAN) 80 MG tablet TAKE 1 TABLET EVERY DAY 90 tablet 2   No current facility-administered medications on file prior to visit.    Allergies  Allergen Reactions  . Penicillins Rash    Past Medical History  Diagnosis Date  . Hypertension   . Hyperlipidemia   . Breast cancer ([place])   . Palpitations   . Arthritis   . Dysrhythmia     pac  . Colitis   . Breast mass, left 03/21/2012    Benign on excisional biopsy 05/01/12     Past Surgical History  Procedure Laterality Date  . Tumor removal      FROM FALLOPIAN TUBE  . Abdominal hysterectomy    . Mastectomy partial / lumpectomy  1999     rt lump-rt axillary dissection-#20  . Tonsillectomy    . Appendectomy    . Cardiac catheterization      rt eye-cataract  . Colonoscopy    . Breast biopsy  05/01/2012    Procedure: BREAST BIOPSY WITH NEEDLE LOCALIZATION;  Surgeon: Haywood Lasso, MD;  Location: [facility];  Service: General;  Laterality: Left;  needle localization left breast biopsy and left nipple biopsy    History  Smoking status  . Never Smoker   Smokeless tobacco  . Not on file    History  Alcohol Use No    Family History  Problem Relation Age of Onset  . Kidney disease Father     Review of Systems:   All other systems were reviewed and are negative.  Physical Exam: BP 120/58 mmHg  Pulse 74  Ht 5\' 3"  (1.6 m)  Wt 68.629 kg (151 lb 4.8 oz)  BMI 26.81 kg/m2 She is a pleasant elderly female in no distress. Her HEENT exam is unremarkable. She has no JVD or bruits. Lungs are clear. Cardiac exam reveals a regular rate and rhythm without gallop, murmur, or click. Abdomen is soft and nontender. She has no masses or bruits. Pulses  are good throughout. She has no edema. Neurologic exam is nonfocal.  LABORATORY DATA: Event monitor 01/02/15: normal.    Assessment / Plan: 1. Palpitations.Normal event monitor. Symptoms have resolved. Will monitor for recurrence.   2. Hypertension, controlled.  3. Hyperlipidemia per primary care.

## 2015-06-24 NOTE — Patient Instructions (Signed)
Continue your current therapy  I will see you in 6 months.   

## 2015-06-30 DIAGNOSIS — H2000 Unspecified acute and subacute iridocyclitis: Secondary | ICD-10-CM | POA: Diagnosis not present

## 2015-07-10 DIAGNOSIS — H26491 Other secondary cataract, right eye: Secondary | ICD-10-CM | POA: Diagnosis not present

## 2015-07-23 ENCOUNTER — Ambulatory Visit
Admission: RE | Admit: 2015-07-23 | Discharge: 2015-07-23 | Disposition: A | Discharge status: Home / Self Care | Payer: Medicare Other | Source: Ambulatory Visit

## 2015-07-23 DIAGNOSIS — Z1231 Encounter for screening mammogram for malignant neoplasm of breast: Secondary | ICD-10-CM | POA: Diagnosis not present

## 2015-07-24 ENCOUNTER — Ambulatory Visit: Payer: Medicare Other

## 2015-07-28 DIAGNOSIS — D485 Neoplasm of uncertain behavior of skin: Secondary | ICD-10-CM | POA: Diagnosis not present

## 2015-07-28 DIAGNOSIS — L821 Other seborrheic keratosis: Secondary | ICD-10-CM | POA: Diagnosis not present

## 2015-07-28 DIAGNOSIS — L820 Inflamed seborrheic keratosis: Secondary | ICD-10-CM | POA: Diagnosis not present

## 2015-07-28 DIAGNOSIS — L570 Actinic keratosis: Secondary | ICD-10-CM | POA: Diagnosis not present

## 2015-07-28 DIAGNOSIS — D225 Melanocytic nevi of trunk: Secondary | ICD-10-CM | POA: Diagnosis not present

## 2015-07-28 DIAGNOSIS — D1801 Hemangioma of skin and subcutaneous tissue: Secondary | ICD-10-CM | POA: Diagnosis not present

## 2015-07-28 DIAGNOSIS — L812 Freckles: Secondary | ICD-10-CM | POA: Diagnosis not present

## 2015-08-19 DIAGNOSIS — M859 Disorder of bone density and structure, unspecified: Secondary | ICD-10-CM | POA: Diagnosis not present

## 2015-08-19 DIAGNOSIS — I10 Essential (primary) hypertension: Secondary | ICD-10-CM | POA: Diagnosis not present

## 2015-08-19 DIAGNOSIS — E784 Other hyperlipidemia: Secondary | ICD-10-CM | POA: Diagnosis not present

## 2015-08-26 DIAGNOSIS — Z0000 Encounter for general adult medical examination without abnormal findings: Secondary | ICD-10-CM | POA: Diagnosis not present

## 2015-08-26 DIAGNOSIS — Z6828 Body mass index (BMI) 28.0-28.9, adult: Secondary | ICD-10-CM | POA: Diagnosis not present

## 2015-08-26 DIAGNOSIS — M859 Disorder of bone density and structure, unspecified: Secondary | ICD-10-CM | POA: Diagnosis not present

## 2015-08-26 DIAGNOSIS — N183 Chronic kidney disease, stage 3 (moderate): Secondary | ICD-10-CM | POA: Diagnosis not present

## 2015-08-26 DIAGNOSIS — C5700 Malignant neoplasm of unspecified fallopian tube: Secondary | ICD-10-CM | POA: Diagnosis not present

## 2015-08-26 DIAGNOSIS — I8310 Varicose veins of unspecified lower extremity with inflammation: Secondary | ICD-10-CM | POA: Diagnosis not present

## 2015-08-26 DIAGNOSIS — C50919 Malignant neoplasm of unspecified site of unspecified female breast: Secondary | ICD-10-CM | POA: Diagnosis not present

## 2015-08-26 DIAGNOSIS — I10 Essential (primary) hypertension: Secondary | ICD-10-CM | POA: Diagnosis not present

## 2015-08-26 DIAGNOSIS — Z1389 Encounter for screening for other disorder: Secondary | ICD-10-CM | POA: Diagnosis not present

## 2015-08-26 DIAGNOSIS — K589 Irritable bowel syndrome without diarrhea: Secondary | ICD-10-CM | POA: Diagnosis not present

## 2015-08-26 DIAGNOSIS — E784 Other hyperlipidemia: Secondary | ICD-10-CM | POA: Diagnosis not present

## 2015-10-01 DIAGNOSIS — N183 Chronic kidney disease, stage 3 (moderate): Secondary | ICD-10-CM | POA: Diagnosis not present

## 2015-10-01 DIAGNOSIS — M859 Disorder of bone density and structure, unspecified: Secondary | ICD-10-CM | POA: Diagnosis not present

## 2015-10-14 ENCOUNTER — Ambulatory Visit (INDEPENDENT_AMBULATORY_CARE_PROVIDER_SITE_OTHER): Payer: Medicare Other | Admitting: Nurse Practitioner

## 2015-10-14 ENCOUNTER — Encounter: Payer: Self-pay | Admitting: Nurse Practitioner

## 2015-10-14 VITALS — BP 149/70 | HR 64 | Ht 63.5 in | Wt 153.8 lb

## 2015-10-14 DIAGNOSIS — R002 Palpitations: Secondary | ICD-10-CM | POA: Diagnosis not present

## 2015-10-14 DIAGNOSIS — I10 Essential (primary) hypertension: Secondary | ICD-10-CM | POA: Diagnosis not present

## 2015-10-14 DIAGNOSIS — E785 Hyperlipidemia, unspecified: Secondary | ICD-10-CM

## 2015-10-14 DIAGNOSIS — I491 Atrial premature depolarization: Secondary | ICD-10-CM | POA: Diagnosis not present

## 2015-10-14 NOTE — Patient Instructions (Signed)
Your physician wants you to follow-up in: 6 months or sooner if needed with Dr [name]. You will receive a reminder letter in the mail two months in advance. If you don't receive a letter, please call our office to schedule the follow-up appointment.

## 2015-10-14 NOTE — Progress Notes (Signed)
Office Visit    Patient Name: Cassandra Lang Date of Encounter: 10/14/2015  Primary Care Provider:  Marton Redwood, MD Primary Cardiologist:  P. [name], MD   Chief Complaint    82 year old female with a history of palpitations and symptomatic PACs, who presents for follow-up.  Past Medical History    Past Medical History  Diagnosis Date  . Essential hypertension   . Hyperlipidemia   . Breast cancer ([place])     a. 1999 s/p R partial mastectomy and chemo/radiation.  . Arthritis   . Palpitations     a. 12/2014 Event Monitor: RSR, no arrhythmias.  . Colitis   . Breast mass, left 03/21/2012    a. Benign on excisional biopsy 05/01/12   . Symptomatic Premature atrial contractions    Past Surgical History  Procedure Laterality Date  . Tumor removal      FROM FALLOPIAN TUBE  . Abdominal hysterectomy    . Mastectomy partial / lumpectomy  1999    rt lump-rt axillary dissection-#20  . Tonsillectomy    . Appendectomy    . Cardiac catheterization      rt eye-cataract  . Colonoscopy    . Breast biopsy  05/01/2012    Procedure: BREAST BIOPSY WITH NEEDLE LOCALIZATION;  Surgeon: Haywood Lasso, MD;  Location: [facility];  Service: General;  Laterality: Left;  needle localization left breast biopsy and left nipple biopsy    Allergies  Allergies  Allergen Reactions  . Penicillins Rash    History of Present Illness    82 year old female with prior history of symptom PACs, hypertension, hyperlipidemia, and remote fallopian tube and breast cancer. She was last seen in clinic by Dr. [name] in March, at which time she was doing well. She presents for routine follow-up, and again notes that she has been doing well. She has occasional palpitations, which have previously been documented to be PACs with subsequent normal event monitor in March 2016. She lives independently by herself and tries to stay active around her home but does not routine exercise. She has not been  experiencing any chest pain or dyspnea and further denies any history of PND, orthopnea, dizziness, syncope, edema, or early satiety. She has a long history of vivid dreams and sometimes nightmares, which she attributes to beta blocker therapy.  Home Medications    Prior to Admission medications   Medication Sig Start Date End Date Taking? Authorizing Provider  acetaminophen (TYLENOL) 325 MG tablet Take 650 mg by mouth every 6 (six) hours as needed.     Yes Historical Provider, MD  aspirin 81 MG tablet Take 81 mg by mouth daily.     Yes Historical Provider, MD  atorvastatin (LIPITOR) 10 MG tablet Take 10 mg by mouth daily.     Yes Historical Provider, MD  Calcium Carbonate-Vitamin D (CALCIUM + D PO) Take 1 tablet by mouth 2 (two) times daily.    Yes Historical Provider, MD  DiphenhydrAMINE HCl (BENADRYL ALLERGY PO) Take by mouth as needed.     Yes Historical Provider, MD  fluticasone (FLONASE) 50 MCG/ACT nasal spray Place 2 sprays into both nostrils daily. 02/04/14  Yes Historical Provider, MD  hydrochlorothiazide (HYDRODIURIL) 25 MG tablet Take 1 tablet (25 mg total) by mouth daily. 04/15/15  Yes [name] [name], MD  metoprolol tartrate (LOPRESSOR) 25 MG tablet Take 25 mg by mouth 2 (two) times daily.   Yes Historical Provider, MD  Multiple Vitamins-Minerals (MULTIVITAMIN PO) Take by mouth daily.  Yes Historical Provider, MD  ranitidine (ZANTAC) 150 MG capsule Take 150 mg by mouth 2 (two) times daily.   Yes Historical Provider, MD  valsartan (DIOVAN) 80 MG tablet TAKE 1 TABLET EVERY DAY 02/27/15  Yes Burtis Junes, NP    Review of Systems    History of vivid dreams and nightmares ever since being on beta blocker therapy. Occasional fluttering in her chest or palpitations. She denies chest pain, dyspnea, PND, orthopnea, dizziness, syncope, edema, or early satiety.  All other systems reviewed and are otherwise negative except as noted above.  Physical Exam    VS:  BP 149/70 mmHg  Pulse 64   Ht 5' 3.5" (1.613 m)  Wt 153 lb 12.8 oz (69.763 kg)  BMI 26.81 kg/m2 , BMI Body mass index is 26.81 kg/(m^2). GEN: Well nourished, well developed, in no acute distress. HEENT: normal. Neck: Supple, no JVD, carotid bruits, or masses. Cardiac: RRR, distant, no murmurs, rubs, or gallops. No clubbing, cyanosis, edema.  Radials/DP/PT 2+ and equal bilaterally.  Respiratory:  Respirations regular and unlabored, clear to auscultation bilaterally. GI: Soft, nontender, nondistended, BS + x 4. MS: no deformity or atrophy. Skin: warm and dry, no rash. Neuro:  Strength and sensation are intact. Psych: Normal affect.  Accessory Clinical Findings    ECG - RSR, 63, 1st deg AVB, no acute st/t changes.  Assessment & Plan    1.  Palpitations/symptom medic PACs: She has a long history of palpitations with normal event monitor last September. She remains on beta blocker therapy and has been on this for a long time. She does note vivid dreams and sometimes nightmares which she attributes to beta blocker therapy but she does not think that she needs to switch off of beta blocker.  2. Essential hypertension: Blood pressure is stable for her age. I will not make any adjustments today.  3. Hyperlipidemia: She is on statin therapy and this is followed by primary care.  4. Disposition: Patient will follow up with Dr. [name] in 6 months or sooner if necessary.   Murray Hodgkins, NP 10/14/2015, 11:57 AM

## 2015-10-27 DIAGNOSIS — L905 Scar conditions and fibrosis of skin: Secondary | ICD-10-CM | POA: Diagnosis not present

## 2015-10-27 DIAGNOSIS — L821 Other seborrheic keratosis: Secondary | ICD-10-CM | POA: Diagnosis not present

## 2015-10-30 DIAGNOSIS — K5792 Diverticulitis of intestine, part unspecified, without perforation or abscess without bleeding: Secondary | ICD-10-CM | POA: Diagnosis not present

## 2015-10-30 DIAGNOSIS — Z6827 Body mass index (BMI) 27.0-27.9, adult: Secondary | ICD-10-CM | POA: Diagnosis not present

## 2015-12-05 ENCOUNTER — Other Ambulatory Visit: Payer: Self-pay | Admitting: Nurse Practitioner

## 2016-01-03 DIAGNOSIS — Z23 Encounter for immunization: Secondary | ICD-10-CM | POA: Diagnosis not present

## 2016-06-03 ENCOUNTER — Other Ambulatory Visit: Payer: Self-pay | Admitting: Cardiology

## 2016-06-03 NOTE — Telephone Encounter (Signed)
Rx(s) sent to pharmacy electronically.  

## 2016-06-10 ENCOUNTER — Other Ambulatory Visit: Payer: Self-pay | Admitting: Internal Medicine

## 2016-06-10 DIAGNOSIS — Z1231 Encounter for screening mammogram for malignant neoplasm of breast: Secondary | ICD-10-CM

## 2016-06-14 DIAGNOSIS — J309 Allergic rhinitis, unspecified: Secondary | ICD-10-CM | POA: Diagnosis not present

## 2016-06-14 DIAGNOSIS — R05 Cough: Secondary | ICD-10-CM | POA: Diagnosis not present

## 2016-06-18 ENCOUNTER — Encounter: Payer: Self-pay | Admitting: Cardiology

## 2016-06-25 ENCOUNTER — Ambulatory Visit: Payer: Medicare Other | Admitting: Cardiology

## 2016-06-25 NOTE — Progress Notes (Deleted)
Cassandra Lang Date of Birth: [birth year]   History of Present Illness: Cassandra Lang is seen for follow up palpitations.  She has a history of PACs, hypertension, and hyperlipidemia. Last Holter in 2012 showed frequent PACs but no other arrhythmia. When last seen she complains of episodes of heart racing and irregularity lasting up to 1.5 hours. An event monitor was placed and was normal. She has no recurrent symptoms. Denies any chest pain or SOB. Overall feels well.   Current Outpatient Prescriptions on File Prior to Visit  Medication Sig Dispense Refill  . acetaminophen (TYLENOL) 325 MG tablet Take 650 mg by mouth every 6 (six) hours as needed.      Marland Kitchen aspirin 81 MG tablet Take 81 mg by mouth daily.      Marland Kitchen atorvastatin (LIPITOR) 10 MG tablet Take 10 mg by mouth daily.      . Calcium Carbonate-Vitamin D (CALCIUM + D PO) Take 1 tablet by mouth 2 (two) times daily.     . DiphenhydrAMINE HCl (BENADRYL ALLERGY PO) Take by mouth as needed.      . fluticasone (FLONASE) 50 MCG/ACT nasal spray Place 2 sprays into both nostrils daily.    . hydrochlorothiazide (HYDRODIURIL) 25 MG tablet Take 1 tablet (25 mg total) by mouth daily. 90 tablet 3  . metoprolol tartrate (LOPRESSOR) 25 MG tablet Take 25 mg by mouth 2 (two) times daily.    . Multiple Vitamins-Minerals (MULTIVITAMIN PO) Take by mouth daily.    . ranitidine (ZANTAC) 150 MG capsule Take 150 mg by mouth 2 (two) times daily.    . valsartan (DIOVAN) 80 MG tablet Take 1 tablet (80 mg total) by mouth daily. 90 tablet 0   No current facility-administered medications on file prior to visit.     Allergies  Allergen Reactions  . Penicillins Rash    Past Medical History:  Diagnosis Date  . Arthritis   . Breast cancer ([place])    a. 1999 s/p R partial mastectomy and chemo/radiation.  . Breast mass, left 03/21/2012   a. Benign on excisional biopsy 05/01/12   . Colitis   . Essential hypertension   . Hyperlipidemia   . Palpitations    a. 12/2014 Event  Monitor: RSR, no arrhythmias.  . Symptomatic Premature atrial contractions     Past Surgical History:  Procedure Laterality Date  . ABDOMINAL HYSTERECTOMY    . APPENDECTOMY    . BREAST BIOPSY  05/01/2012   Procedure: BREAST BIOPSY WITH NEEDLE LOCALIZATION;  Surgeon: Haywood Lasso, MD;  Location: [facility];  Service: General;  Laterality: Left;  needle localization left breast biopsy and left nipple biopsy  . CARDIAC CATHETERIZATION     rt eye-cataract  . COLONOSCOPY    . MASTECTOMY PARTIAL / LUMPECTOMY  1999   rt lump-rt axillary dissection-#20  . TONSILLECTOMY    . TUMOR REMOVAL     FROM FALLOPIAN TUBE    History  Smoking Status  . Never Smoker  Smokeless Tobacco  . Never Used    History  Alcohol Use No    Family History  Problem Relation Age of Onset  . Kidney disease Father     Review of Systems:   All other systems were reviewed and are negative.  Physical Exam: There were no vitals taken for this visit. She is a pleasant elderly female in no distress. Her HEENT exam is unremarkable. She has no JVD or bruits. Lungs are clear. Cardiac exam reveals a regular  rate and rhythm without gallop, murmur, or click. Abdomen is soft and nontender. She has no masses or bruits. Pulses are good throughout. She has no edema. Neurologic exam is nonfocal.  LABORATORY DATA:  Labs dated 08/19/15: cholesterol 183, triglycerides 184, HDL 59, LDL 87. TSH is normal Dated 10/30/15: CMET and CBC normal.    Event monitor 01/02/15: normal.    Assessment / Plan: 1. Palpitations.Normal event monitor. Symptoms have resolved. Will monitor for recurrence.   2. Hypertension, controlled.  3. Hyperlipidemia per primary care.

## 2016-06-28 ENCOUNTER — Ambulatory Visit: Payer: Medicare Other | Admitting: Cardiology

## 2016-06-29 NOTE — Progress Notes (Signed)
Cassandra Lang Date of Birth: 1924-08-26   History of Present Illness: Cassandra Lang is seen for follow up palpitations.  She has a history of PACs, hypertension, and hyperlipidemia. Last Holter in 2012 showed frequent PACs but no other arrhythmia. When last seen she complains of episodes of heart racing and irregularity lasting up to 1.5 hours. An event monitor was placed and was normal. She states that since she wore the monitor she has no recurrent palpitations. She is still active at age 83 and lives independently. No chest pain or SOB.   Current Outpatient Prescriptions on File Prior to Visit  Medication Sig Dispense Refill  . acetaminophen (TYLENOL) 325 MG tablet Take 650 mg by mouth every 6 (six) hours as needed.      Marland Kitchen aspirin 81 MG tablet Take 81 mg by mouth daily.      Marland Kitchen atorvastatin (LIPITOR) 10 MG tablet Take 10 mg by mouth daily.      . Calcium Carbonate-Vitamin D (CALCIUM + D PO) Take 1 tablet by mouth 2 (two) times daily.     . DiphenhydrAMINE HCl (BENADRYL ALLERGY PO) Take by mouth as needed.      . fluticasone (FLONASE) 50 MCG/ACT nasal spray Place 2 sprays into both nostrils daily.    . hydrochlorothiazide (HYDRODIURIL) 25 MG tablet Take 1 tablet (25 mg total) by mouth daily. 90 tablet 3  . metoprolol tartrate (LOPRESSOR) 25 MG tablet Take 25 mg by mouth 2 (two) times daily.    . Multiple Vitamins-Minerals (MULTIVITAMIN PO) Take by mouth daily.    . ranitidine (ZANTAC) 150 MG capsule Take 150 mg by mouth 2 (two) times daily.    . valsartan (DIOVAN) 80 MG tablet Take 1 tablet (80 mg total) by mouth daily. 90 tablet 0   No current facility-administered medications on file prior to visit.     Allergies  Allergen Reactions  . Penicillins Rash    Past Medical History:  Diagnosis Date  . Arthritis   . Breast cancer ([place])    a. 1999 s/p R partial mastectomy and chemo/radiation.  . Breast mass, left 03/21/2012   a. Benign on excisional biopsy 05/01/12   . Colitis   . Essential  hypertension   . Hyperlipidemia   . Palpitations    a. 12/2014 Event Monitor: RSR, no arrhythmias.  . Symptomatic Premature atrial contractions     Past Surgical History:  Procedure Laterality Date  . ABDOMINAL HYSTERECTOMY    . APPENDECTOMY    . BREAST BIOPSY  05/01/2012   Procedure: BREAST BIOPSY WITH NEEDLE LOCALIZATION;  Surgeon: Haywood Lasso, MD;  Location: [facility];  Service: General;  Laterality: Left;  needle localization left breast biopsy and left nipple biopsy  . CARDIAC CATHETERIZATION     rt eye-cataract  . COLONOSCOPY    . MASTECTOMY PARTIAL / LUMPECTOMY  1999   rt lump-rt axillary dissection-#20  . TONSILLECTOMY    . TUMOR REMOVAL     FROM FALLOPIAN TUBE    History  Smoking Status  . Never Smoker  Smokeless Tobacco  . Never Used    History  Alcohol Use No    Family History  Problem Relation Age of Onset  . Kidney disease Father     Review of Systems:   All other systems were reviewed and are negative.  Physical Exam: BP (!) 140/50   Pulse 69   Ht 5\' 3"  (1.6 m)   Wt 149 lb (67.6 kg)  BMI 26.39 kg/m  She is a pleasant elderly female in no distress. Her HEENT exam is unremarkable. She has no JVD or bruits. Lungs are clear. Cardiac exam reveals a regular rate and rhythm without gallop, murmur, or click. Abdomen is soft and nontender. She has no masses or bruits. Pulses are good throughout. She has no edema. Neurologic exam is nonfocal.  LABORATORY DATA:  Dated 08/19/15: cholesterol 183, triglycerides 184, HDL 59, LDL 87. TSH normal.  Dated 10/30/15: CBC and CMET are normal  Event monitor 01/02/15: normal.    Assessment / Plan: 1. Palpitations.Normal event monitor. Symptoms have resolved. Will monitor for recurrence. I will follow up in one year.  2. Hypertension, controlled. Continue current medications.  3. Hyperlipidemia per primary care.

## 2016-07-01 ENCOUNTER — Encounter: Payer: Self-pay | Admitting: Cardiology

## 2016-07-01 ENCOUNTER — Ambulatory Visit (INDEPENDENT_AMBULATORY_CARE_PROVIDER_SITE_OTHER): Payer: Medicare Other | Admitting: Cardiology

## 2016-07-01 VITALS — BP 140/50 | HR 69 | Ht 63.0 in | Wt 149.0 lb

## 2016-07-01 DIAGNOSIS — I10 Essential (primary) hypertension: Secondary | ICD-10-CM

## 2016-07-01 DIAGNOSIS — E7800 Pure hypercholesterolemia, unspecified: Secondary | ICD-10-CM | POA: Diagnosis not present

## 2016-07-01 DIAGNOSIS — I491 Atrial premature depolarization: Secondary | ICD-10-CM | POA: Diagnosis not present

## 2016-07-01 NOTE — Patient Instructions (Signed)
Continue your therapy  I will see you in one year

## 2016-07-13 DIAGNOSIS — M7061 Trochanteric bursitis, right hip: Secondary | ICD-10-CM | POA: Diagnosis not present

## 2016-07-13 DIAGNOSIS — M1611 Unilateral primary osteoarthritis, right hip: Secondary | ICD-10-CM | POA: Diagnosis not present

## 2016-07-26 DIAGNOSIS — L570 Actinic keratosis: Secondary | ICD-10-CM | POA: Diagnosis not present

## 2016-07-26 DIAGNOSIS — L820 Inflamed seborrheic keratosis: Secondary | ICD-10-CM | POA: Diagnosis not present

## 2016-07-26 DIAGNOSIS — L821 Other seborrheic keratosis: Secondary | ICD-10-CM | POA: Diagnosis not present

## 2016-07-26 DIAGNOSIS — D225 Melanocytic nevi of trunk: Secondary | ICD-10-CM | POA: Diagnosis not present

## 2016-07-26 DIAGNOSIS — D1801 Hemangioma of skin and subcutaneous tissue: Secondary | ICD-10-CM | POA: Diagnosis not present

## 2016-07-27 DIAGNOSIS — H52203 Unspecified astigmatism, bilateral: Secondary | ICD-10-CM | POA: Diagnosis not present

## 2016-07-27 DIAGNOSIS — Z961 Presence of intraocular lens: Secondary | ICD-10-CM | POA: Diagnosis not present

## 2016-07-27 DIAGNOSIS — H04123 Dry eye syndrome of bilateral lacrimal glands: Secondary | ICD-10-CM | POA: Diagnosis not present

## 2016-07-29 ENCOUNTER — Ambulatory Visit
Admission: RE | Admit: 2016-07-29 | Discharge: 2016-07-29 | Disposition: A | Discharge status: Home / Self Care | Payer: Medicare Other | Source: Ambulatory Visit | Attending: Internal Medicine | Admitting: Internal Medicine

## 2016-07-29 DIAGNOSIS — Z1231 Encounter for screening mammogram for malignant neoplasm of breast: Secondary | ICD-10-CM

## 2016-08-23 DIAGNOSIS — M859 Disorder of bone density and structure, unspecified: Secondary | ICD-10-CM | POA: Diagnosis not present

## 2016-08-23 DIAGNOSIS — E784 Other hyperlipidemia: Secondary | ICD-10-CM | POA: Diagnosis not present

## 2016-08-23 DIAGNOSIS — I10 Essential (primary) hypertension: Secondary | ICD-10-CM | POA: Diagnosis not present

## 2016-08-30 ENCOUNTER — Other Ambulatory Visit (HOSPITAL_COMMUNITY): Payer: Self-pay | Admitting: Internal Medicine

## 2016-08-30 DIAGNOSIS — I10 Essential (primary) hypertension: Secondary | ICD-10-CM | POA: Diagnosis not present

## 2016-08-30 DIAGNOSIS — K589 Irritable bowel syndrome without diarrhea: Secondary | ICD-10-CM | POA: Diagnosis not present

## 2016-08-30 DIAGNOSIS — R2689 Other abnormalities of gait and mobility: Secondary | ICD-10-CM | POA: Diagnosis not present

## 2016-08-30 DIAGNOSIS — M859 Disorder of bone density and structure, unspecified: Secondary | ICD-10-CM | POA: Diagnosis not present

## 2016-08-30 DIAGNOSIS — C50919 Malignant neoplasm of unspecified site of unspecified female breast: Secondary | ICD-10-CM | POA: Diagnosis not present

## 2016-08-30 DIAGNOSIS — R05 Cough: Secondary | ICD-10-CM | POA: Diagnosis not present

## 2016-08-30 DIAGNOSIS — R1310 Dysphagia, unspecified: Secondary | ICD-10-CM | POA: Diagnosis not present

## 2016-08-30 DIAGNOSIS — K219 Gastro-esophageal reflux disease without esophagitis: Secondary | ICD-10-CM | POA: Diagnosis not present

## 2016-08-30 DIAGNOSIS — Z0000 Encounter for general adult medical examination without abnormal findings: Secondary | ICD-10-CM | POA: Diagnosis not present

## 2016-08-30 DIAGNOSIS — E784 Other hyperlipidemia: Secondary | ICD-10-CM | POA: Diagnosis not present

## 2016-08-30 DIAGNOSIS — N183 Chronic kidney disease, stage 3 (moderate): Secondary | ICD-10-CM | POA: Diagnosis not present

## 2016-08-30 DIAGNOSIS — Z6827 Body mass index (BMI) 27.0-27.9, adult: Secondary | ICD-10-CM | POA: Diagnosis not present

## 2016-08-30 DIAGNOSIS — R1319 Other dysphagia: Secondary | ICD-10-CM

## 2016-09-02 ENCOUNTER — Other Ambulatory Visit: Payer: Self-pay | Admitting: Cardiology

## 2016-09-03 ENCOUNTER — Ambulatory Visit (HOSPITAL_COMMUNITY): Payer: Medicare Other

## 2016-09-06 ENCOUNTER — Ambulatory Visit (HOSPITAL_COMMUNITY)
Admission: RE | Admit: 2016-09-06 | Discharge: 2016-09-06 | Disposition: A | Discharge status: Home / Self Care | Payer: Medicare Other | Source: Ambulatory Visit | Attending: Internal Medicine | Admitting: Internal Medicine

## 2016-09-06 DIAGNOSIS — R05 Cough: Secondary | ICD-10-CM | POA: Insufficient documentation

## 2016-09-06 DIAGNOSIS — R1310 Dysphagia, unspecified: Secondary | ICD-10-CM | POA: Diagnosis not present

## 2016-09-06 DIAGNOSIS — R1319 Other dysphagia: Secondary | ICD-10-CM

## 2016-09-17 DIAGNOSIS — R1314 Dysphagia, pharyngoesophageal phase: Secondary | ICD-10-CM | POA: Diagnosis not present

## 2017-01-03 ENCOUNTER — Other Ambulatory Visit (HOSPITAL_COMMUNITY): Payer: Self-pay | Admitting: Internal Medicine

## 2017-01-06 ENCOUNTER — Other Ambulatory Visit (HOSPITAL_COMMUNITY): Payer: Self-pay | Admitting: Internal Medicine

## 2017-01-06 DIAGNOSIS — R1319 Other dysphagia: Secondary | ICD-10-CM

## 2017-01-07 ENCOUNTER — Ambulatory Visit (HOSPITAL_COMMUNITY): Payer: Medicare Other

## 2017-01-07 ENCOUNTER — Other Ambulatory Visit (HOSPITAL_COMMUNITY): Payer: Medicare Other

## 2017-01-14 ENCOUNTER — Ambulatory Visit (HOSPITAL_COMMUNITY)
Admission: RE | Admit: 2017-01-14 | Discharge: 2017-01-14 | Disposition: A | Discharge status: Home / Self Care | Payer: Medicare Other | Source: Ambulatory Visit | Attending: Internal Medicine | Admitting: Internal Medicine

## 2017-01-14 DIAGNOSIS — R1313 Dysphagia, pharyngeal phase: Secondary | ICD-10-CM | POA: Diagnosis not present

## 2017-01-14 DIAGNOSIS — T17308A Unspecified foreign body in larynx causing other injury, initial encounter: Secondary | ICD-10-CM | POA: Diagnosis not present

## 2017-01-14 DIAGNOSIS — R1319 Other dysphagia: Secondary | ICD-10-CM

## 2017-01-15 DIAGNOSIS — Z23 Encounter for immunization: Secondary | ICD-10-CM | POA: Diagnosis not present

## 2017-04-08 DIAGNOSIS — M1712 Unilateral primary osteoarthritis, left knee: Secondary | ICD-10-CM | POA: Diagnosis not present

## 2017-04-08 DIAGNOSIS — M25562 Pain in left knee: Secondary | ICD-10-CM | POA: Diagnosis not present

## 2017-05-08 NOTE — Progress Notes (Signed)
Kandyce Rud Bordeaux Date of Birth: [birth year]   History of Present Illness: Ms. Ventress is seen for follow up.  She has a history of PACs, hypertension, and hyperlipidemia. Last Holter in 2012 showed frequent PACs but no other arrhythmia. When last seen she complains of episodes of heart racing and irregularity lasting up to 1.5 hours. An event monitor was placed and was normal. She states that since she wore the monitor she has no recurrent palpitations. She is still active at age 84 and lives independently. No chest pain or SOB. She does sometimes have a sensation of choking while eating. She had swallowing evaluation in May that showed some aspiration but repeat in September did not. She has a transient sense of "doom" when she lies down at night. Only lasts a couple of seconds.    Current Outpatient Medications on File Prior to Visit  Medication Sig Dispense Refill  . acetaminophen (TYLENOL) 325 MG tablet Take 650 mg by mouth every 6 (six) hours as needed.      Marland Kitchen aspirin 81 MG tablet Take 81 mg by mouth daily.      Marland Kitchen atorvastatin (LIPITOR) 10 MG tablet Take 10 mg by mouth daily.      . Calcium Carbonate-Vitamin D (CALCIUM + D PO) Take 1 tablet by mouth 2 (two) times daily.     . DiphenhydrAMINE HCl (BENADRYL ALLERGY PO) Take by mouth as needed.      . fluticasone (FLONASE) 50 MCG/ACT nasal spray Place 2 sprays into both nostrils daily.    . hydrochlorothiazide (HYDRODIURIL) 25 MG tablet Take 1 tablet (25 mg total) by mouth daily. 90 tablet 3  . metoprolol tartrate (LOPRESSOR) 25 MG tablet Take 25 mg by mouth 2 (two) times daily.    . Multiple Vitamins-Minerals (MULTIVITAMIN PO) Take by mouth daily.    Marland Kitchen olmesartan (BENICAR) 20 MG tablet Take 20 mg by mouth daily.  6  . ranitidine (ZANTAC) 150 MG capsule Take 150 mg by mouth 2 (two) times daily.     No current facility-administered medications on file prior to visit.     Allergies  Allergen Reactions  . Penicillins Rash    Past Medical  History:  Diagnosis Date  . Arthritis   . Breast cancer ([place])    a. 1999 s/p R partial mastectomy and chemo/radiation.  . Breast mass, left 03/21/2012   a. Benign on excisional biopsy 05/01/12   . Colitis   . Essential hypertension   . Hyperlipidemia   . Palpitations    a. 12/2014 Event Monitor: RSR, no arrhythmias.  . Symptomatic Premature atrial contractions     Past Surgical History:  Procedure Laterality Date  . ABDOMINAL HYSTERECTOMY    . APPENDECTOMY    . BREAST BIOPSY  05/01/2012   Procedure: BREAST BIOPSY WITH NEEDLE LOCALIZATION;  Surgeon: Haywood Lasso, MD;  Location: [facility];  Service: General;  Laterality: Left;  needle localization left breast biopsy and left nipple biopsy  . BREAST EXCISIONAL BIOPSY Left 05/01/2012   benign  . BREAST EXCISIONAL BIOPSY Bilateral 03/08/1998   malignant  . BREAST LUMPECTOMY Right 03/08/1998  . CARDIAC CATHETERIZATION     rt eye-cataract  . COLONOSCOPY    . MASTECTOMY PARTIAL / LUMPECTOMY  1999   rt lump-rt axillary dissection-#20  . TONSILLECTOMY    . TUMOR REMOVAL     FROM FALLOPIAN TUBE    Social History   Tobacco Use  Smoking Status Never Smoker  Smokeless Tobacco  Never Used    Social History   Substance and Sexual Activity  Alcohol Use No    Family History  Problem Relation Age of Onset  . Kidney disease Father   . Breast cancer Sister     Review of Systems:   All other systems were reviewed and are negative.  Physical Exam: BP (!) 122/54   Pulse 70   Ht 5' 2.5" (1.588 m)   Wt 145 lb 12.8 oz (66.1 kg)   BMI 26.24 kg/m  GENERAL:  Well appearing WF in NAD HEENT:  PERRL, EOMI, sclera are clear. Oropharynx is clear. NECK:  No jugular venous distention, carotid upstroke brisk and symmetric, no bruits, no thyromegaly or adenopathy LUNGS:  Clear to auscultation bilaterally CHEST:  Unremarkable HEART:  RRR,  PMI not displaced or sustained,S1 and S2 within normal limits, no S3, no S4: no  clicks, no rubs, no murmurs ABD:  Soft, nontender. BS +, no masses or bruits. No hepatomegaly, no splenomegaly EXT:  2 + pulses throughout, no edema, no cyanosis no clubbing SKIN:  Warm and dry.  No rashes NEURO:  Alert and oriented x 3. Cranial nerves II through XII intact. PSYCH:  Cognitively intact    LABORATORY DATA:  Dated 08/19/15: cholesterol 183, triglycerides 184, HDL 59, LDL 87. TSH normal.  Dated 10/30/15: CBC and CMET are normal Dated 09/02/16: cholesterol 171, triglycerides 199, HDL 57, LDL 74. CBC, chemistries and TSH normal.  Event monitor 01/02/15: normal.   Ecg today shows NSR rate 70, normal Ecg. I have personally reviewed and interpreted this study.    Assessment / Plan: 1. Palpitations.Normal event monitor 2016. Symptoms have resolved. Ecg today is normal  2. Hypertension, controlled. Continue current medications.  3. Hyperlipidemia per primary care.  I will follow up in one year.

## 2017-05-09 ENCOUNTER — Encounter: Payer: Self-pay | Admitting: Cardiology

## 2017-05-09 ENCOUNTER — Ambulatory Visit (INDEPENDENT_AMBULATORY_CARE_PROVIDER_SITE_OTHER): Payer: Medicare Other | Admitting: Cardiology

## 2017-05-09 VITALS — BP 122/54 | HR 70 | Ht 62.5 in | Wt 145.8 lb

## 2017-05-09 DIAGNOSIS — I10 Essential (primary) hypertension: Secondary | ICD-10-CM

## 2017-05-09 DIAGNOSIS — E7800 Pure hypercholesterolemia, unspecified: Secondary | ICD-10-CM | POA: Diagnosis not present

## 2017-05-09 NOTE — Patient Instructions (Addendum)
Continue your current therapy  I will see you in one year   

## 2017-06-20 ENCOUNTER — Other Ambulatory Visit: Payer: Self-pay | Admitting: Internal Medicine

## 2017-06-20 DIAGNOSIS — Z1231 Encounter for screening mammogram for malignant neoplasm of breast: Secondary | ICD-10-CM

## 2017-07-07 DIAGNOSIS — L728 Other follicular cysts of the skin and subcutaneous tissue: Secondary | ICD-10-CM | POA: Diagnosis not present

## 2017-07-07 DIAGNOSIS — L821 Other seborrheic keratosis: Secondary | ICD-10-CM | POA: Diagnosis not present

## 2017-07-07 DIAGNOSIS — L820 Inflamed seborrheic keratosis: Secondary | ICD-10-CM | POA: Diagnosis not present

## 2017-08-02 ENCOUNTER — Ambulatory Visit
Admission: RE | Admit: 2017-08-02 | Discharge: 2017-08-02 | Disposition: A | Discharge status: Home / Self Care | Payer: Medicare Other | Source: Ambulatory Visit | Attending: Internal Medicine | Admitting: Internal Medicine

## 2017-08-02 DIAGNOSIS — Z1231 Encounter for screening mammogram for malignant neoplasm of breast: Secondary | ICD-10-CM

## 2017-08-09 DIAGNOSIS — H04123 Dry eye syndrome of bilateral lacrimal glands: Secondary | ICD-10-CM | POA: Diagnosis not present

## 2017-08-09 DIAGNOSIS — Z961 Presence of intraocular lens: Secondary | ICD-10-CM | POA: Diagnosis not present

## 2017-08-09 DIAGNOSIS — H52203 Unspecified astigmatism, bilateral: Secondary | ICD-10-CM | POA: Diagnosis not present

## 2017-08-30 DIAGNOSIS — I10 Essential (primary) hypertension: Secondary | ICD-10-CM | POA: Diagnosis not present

## 2017-08-30 DIAGNOSIS — E7849 Other hyperlipidemia: Secondary | ICD-10-CM | POA: Diagnosis not present

## 2017-08-30 DIAGNOSIS — M859 Disorder of bone density and structure, unspecified: Secondary | ICD-10-CM | POA: Diagnosis not present

## 2017-08-30 DIAGNOSIS — R82998 Other abnormal findings in urine: Secondary | ICD-10-CM | POA: Diagnosis not present

## 2017-09-06 DIAGNOSIS — M859 Disorder of bone density and structure, unspecified: Secondary | ICD-10-CM | POA: Diagnosis not present

## 2017-09-06 DIAGNOSIS — N183 Chronic kidney disease, stage 3 (moderate): Secondary | ICD-10-CM | POA: Diagnosis not present

## 2017-09-06 DIAGNOSIS — R05 Cough: Secondary | ICD-10-CM | POA: Diagnosis not present

## 2017-09-06 DIAGNOSIS — Z1389 Encounter for screening for other disorder: Secondary | ICD-10-CM | POA: Diagnosis not present

## 2017-09-06 DIAGNOSIS — K589 Irritable bowel syndrome without diarrhea: Secondary | ICD-10-CM | POA: Diagnosis not present

## 2017-09-06 DIAGNOSIS — K219 Gastro-esophageal reflux disease without esophagitis: Secondary | ICD-10-CM | POA: Diagnosis not present

## 2017-09-06 DIAGNOSIS — E7849 Other hyperlipidemia: Secondary | ICD-10-CM | POA: Diagnosis not present

## 2017-09-06 DIAGNOSIS — I10 Essential (primary) hypertension: Secondary | ICD-10-CM | POA: Diagnosis not present

## 2017-09-06 DIAGNOSIS — R269 Unspecified abnormalities of gait and mobility: Secondary | ICD-10-CM | POA: Diagnosis not present

## 2017-09-06 DIAGNOSIS — R1310 Dysphagia, unspecified: Secondary | ICD-10-CM | POA: Diagnosis not present

## 2017-09-06 DIAGNOSIS — Z0000 Encounter for general adult medical examination without abnormal findings: Secondary | ICD-10-CM | POA: Diagnosis not present

## 2017-09-06 DIAGNOSIS — C5700 Malignant neoplasm of unspecified fallopian tube: Secondary | ICD-10-CM | POA: Diagnosis not present

## 2017-10-31 DIAGNOSIS — I10 Essential (primary) hypertension: Secondary | ICD-10-CM | POA: Diagnosis not present

## 2017-10-31 DIAGNOSIS — Z6827 Body mass index (BMI) 27.0-27.9, adult: Secondary | ICD-10-CM | POA: Diagnosis not present

## 2017-10-31 DIAGNOSIS — R269 Unspecified abnormalities of gait and mobility: Secondary | ICD-10-CM | POA: Diagnosis not present

## 2018-01-10 DIAGNOSIS — Z23 Encounter for immunization: Secondary | ICD-10-CM | POA: Diagnosis not present

## 2018-02-27 DIAGNOSIS — Z6827 Body mass index (BMI) 27.0-27.9, adult: Secondary | ICD-10-CM | POA: Diagnosis not present

## 2018-02-27 DIAGNOSIS — I10 Essential (primary) hypertension: Secondary | ICD-10-CM | POA: Diagnosis not present

## 2018-02-27 DIAGNOSIS — R0609 Other forms of dyspnea: Secondary | ICD-10-CM | POA: Diagnosis not present

## 2018-02-27 DIAGNOSIS — I4891 Unspecified atrial fibrillation: Secondary | ICD-10-CM | POA: Diagnosis not present

## 2018-02-28 ENCOUNTER — Ambulatory Visit (INDEPENDENT_AMBULATORY_CARE_PROVIDER_SITE_OTHER): Payer: Medicare Other | Admitting: Physician Assistant

## 2018-02-28 ENCOUNTER — Encounter: Payer: Self-pay | Admitting: Physician Assistant

## 2018-02-28 VITALS — BP 140/80 | HR 95 | Resp 16 | Ht 62.0 in | Wt 151.2 lb

## 2018-02-28 DIAGNOSIS — I480 Paroxysmal atrial fibrillation: Secondary | ICD-10-CM

## 2018-02-28 DIAGNOSIS — E785 Hyperlipidemia, unspecified: Secondary | ICD-10-CM | POA: Diagnosis not present

## 2018-02-28 DIAGNOSIS — I10 Essential (primary) hypertension: Secondary | ICD-10-CM

## 2018-02-28 NOTE — Progress Notes (Signed)
Cardiology Office Note    Date:  03/02/2018   ID:  Cassandra Lang, Cassandra Lang 07-14-1924, MRN [id number]  PCP:  Marton Redwood, MD  Cardiologist:  Dr. [name]   Chief Complaint  Patient presents with  . Atrial Fibrillation    new afib, started on eliquis by PCP yesterday    History of Present Illness:  Cassandra Lang is a 84 y.o. female with PMH of PACs, HTN, and HLD.  Holter monitor in 2012 showed frequent PACs but no significant arrhythmia.  She had a lower GI bleed secondary to mesenteric ischemia in June 2012.  She also had a event monitor in 2016 that was also normal.  Patient was last seen by Dr. [name] in January 2019, she was doing well at the time.  Patient has been having some occasional palpitation and fatigued recently.  She obtained a EKG at her primary care provider's office which showed she had a new atrial fibrillation with rate controlled rhythm.  She was started on 5 mg twice daily of Eliquis.  So far she had 2 doses of Eliquis.  She presents today for cardiology office evaluation.  Her heart rate is very well controlled on the previous home beta-blocker.  She remain in atrial fibrillation. She denies any recent chest pain. She is largely asymptomatic except for some fatigue.  We discussed alternative therapy including Coumadin and NOAC.  She is quite active and has not had frequent fall.  In the last year, she has fallen twice.  She mentions a second fall was related to tripping over her own feet. She denies any significant bleeding issue. She has some orthostatic dizziness and require sitting on the side of the bed in the morning however she is quite careful.  Otherwise for 84 year old, she is quite active at home.  Given her CHA2DS2-Score of 31 (female, age, and HTN), I agree with initiation for Eliquis.  I plan to bring the patient back in 3 weeks and to discuss possible outpatient DC cardioversion.   Past Medical History:  Diagnosis Date  . Arthritis   . Breast cancer ([place])    a.  1999 s/p R partial mastectomy and chemo/radiation.  . Breast mass, left 03/21/2012   a. Benign on excisional biopsy 05/01/12   . Colitis   . Essential hypertension   . Hyperlipidemia   . Palpitations    a. 12/2014 Event Monitor: RSR, no arrhythmias.  . Symptomatic Premature atrial contractions     Past Surgical History:  Procedure Laterality Date  . ABDOMINAL HYSTERECTOMY    . APPENDECTOMY    . BREAST BIOPSY  05/01/2012   Procedure: BREAST BIOPSY WITH NEEDLE LOCALIZATION;  Surgeon: Haywood Lasso, MD;  Location: [place];  Service: General;  Laterality: Left;  needle localization left breast biopsy and left nipple biopsy  . BREAST EXCISIONAL BIOPSY Left 05/01/2012   benign  . BREAST EXCISIONAL BIOPSY Bilateral 03/08/1998   malignant  . BREAST LUMPECTOMY Right 03/08/1998  . CARDIAC CATHETERIZATION     rt eye-cataract  . COLONOSCOPY    . MASTECTOMY PARTIAL / LUMPECTOMY  1999   rt lump-rt axillary dissection-#20  . TONSILLECTOMY    . TUMOR REMOVAL     FROM FALLOPIAN TUBE    Current Medications: Outpatient Medications Prior to Visit  Medication Sig Dispense Refill  . acetaminophen (TYLENOL) 325 MG tablet Take 650 mg by mouth every 6 (six) hours as needed.      Marland Kitchen aspirin 81 MG tablet Take  81 mg by mouth daily.      Marland Kitchen atorvastatin (LIPITOR) 10 MG tablet Take 10 mg by mouth daily.      . Calcium Carbonate-Vitamin D (CALCIUM + D PO) Take 1 tablet by mouth 2 (two) times daily.     . DiphenhydrAMINE HCl (BENADRYL ALLERGY PO) Take by mouth as needed.      . fluticasone (FLONASE) 50 MCG/ACT nasal spray Place 2 sprays into both nostrils daily.    . hydrochlorothiazide (HYDRODIURIL) 25 MG tablet Take 1 tablet (25 mg total) by mouth daily. 90 tablet 3  . metoprolol tartrate (LOPRESSOR) 25 MG tablet Take 25 mg by mouth 2 (two) times daily.    . Multiple Vitamins-Minerals (MULTIVITAMIN PO) Take by mouth daily.    Marland Kitchen olmesartan (BENICAR) 20 MG tablet Take 20 mg by mouth  daily.  6  . ELIQUIS 5 MG TABS tablet     . ranitidine (ZANTAC) 150 MG capsule Take 150 mg by mouth 2 (two) times daily.     No facility-administered medications prior to visit.      Allergies:   Penicillins   Social History   Socioeconomic History  . Marital status: Widowed    Spouse name: Not on file  . Number of children: 1  . Years of education: Not on file  . Highest education level: Not on file  Occupational History  . Not on file  Social Needs  . Financial resource strain: Not on file  . Food insecurity:    Worry: Not on file    Inability: Not on file  . Transportation needs:    Medical: Not on file    Non-medical: Not on file  Tobacco Use  . Smoking status: Never Smoker  . Smokeless tobacco: Never Used  Substance and Sexual Activity  . Alcohol use: No  . Drug use: No  . Sexual activity: Not on file  Lifestyle  . Physical activity:    Days per week: Not on file    Minutes per session: Not on file  . Stress: Not on file  Relationships  . Social connections:    Talks on phone: Not on file    Gets together: Not on file    Attends religious service: Not on file    Active member of club or organization: Not on file    Attends meetings of clubs or organizations: Not on file    Relationship status: Not on file  Other Topics Concern  . Not on file  Social History Narrative  . Not on file     Family History:  The patient's family history includes Breast cancer in her sister; Kidney disease in her father.   ROS:   Please see the history of present illness.    ROS All other systems reviewed and are negative.   PHYSICAL EXAM:   VS:  BP 140/80   Pulse 95   Resp 16   Ht 5\' 2"  (1.575 m)   Wt 151 lb 3.2 oz (68.6 kg)   SpO2 96%   BMI 27.65 kg/m    GEN: Well nourished, well developed, in no acute distress  HEENT: normal  Neck: no JVD, carotid bruits, or masses Cardiac: RRR; no murmurs, rubs, or gallops,no edema  Respiratory:  clear to auscultation  bilaterally, normal work of breathing GI: soft, nontender, nondistended, + BS MS: no deformity or atrophy  Skin: warm and dry, no rash Neuro:  Alert and Oriented x 3, Strength and sensation are intact Psych:  euthymic mood, full affect  Wt Readings from Last 3 Encounters:  02/28/18 151 lb 3.2 oz (68.6 kg)  05/09/17 145 lb 12.8 oz (66.1 kg)  07/01/16 149 lb (67.6 kg)      Studies/Labs Reviewed:   EKG:  EKG is ordered today.  The ekg ordered today demonstrates atrial fibrillation with occasional PVC, heart rate 77  Recent Labs: No results found for requested labs within last 8760 hours.   Lipid Panel No results found for: CHOL, TRIG, HDL, CHOLHDL, VLDL, LDLCALC, LDLDIRECT  Additional studies/ records that were reviewed today include:   Event monitor 01/02/2015 Study Highlights    Normal sinus rhythm  No arrhythmia seen      ASSESSMENT:    1. Paroxysmal atrial fibrillation (HCC)   2. Essential hypertension   3. Hyperlipidemia, unspecified hyperlipidemia type      PLAN:  In order of problems listed above:  1. Paroxysmal atrial fibrillation: Currently on beta-blocker for rate control.  Heart rate very well controlled.  Started on Eliquis by primary care provider after found to have new atrial fibrillation.  - This patients CHA2DS2-VASc Score and unadjusted Ischemic Stroke Rate (% per year) is equal to 4.8 % stroke rate/year from a score of 4  Above score calculated as 1 point each if present [CHF, HTN, DM, Vascular=MI/PAD/Aortic Plaque, Age if 65-74, or Female] Above score calculated as 2 points each if present [Age > 75, or Stroke/TIA/TE]  -I plan to bring the patient back in 3 weeks to discuss possible outpatient cardioversion.  She is largely asymptomatic other than fatigue.  Although she does occasionally notice palpitation, she does not have any palpitations right now even though she is still in atrial fibrillation.  2. Hypertension: Blood pressure well controlled  on current therapy  3. Hyperlipidemia: On Lipitor 10 mg daily.    Medication Adjustments/Labs and Tests Ordered: Current medicines are reviewed at length with the patient today.  Concerns regarding medicines are outlined above.  Medication changes, Labs and Tests ordered today are listed in the Patient Instructions below. Patient Instructions  Medication Instructions:  Your physician recommends that you continue on your current medications as directed. Please refer to the Current Medication list given to you today.  If you need a refill on your cardiac medications before your next appointment, please call your pharmacy.   Lab work: Your physician recommends that you return for lab work in 2 weeks (Tuesday, November 26): CBC  If you have labs (blood work) drawn today and your tests are completely normal, you will receive your results only by: Marland Kitchen MyChart Message (if you have MyChart) OR . A paper copy in the mail If you have any lab test that is abnormal or we need to change your treatment, we will call you to review the results.  Testing/Procedures: Your physician has requested that you have an echocardiogram. Echocardiography is a painless test that uses sound waves to create images of your heart. It provides your doctor with information about the size and shape of your heart and how well your heart's chambers and valves are working. This procedure takes approximately one hour. There are no restrictions for this procedure.  Follow-Up: At [facility], you and your health needs are our priority.  As part of our continuing mission to provide you with exceptional heart care, we have created designated Provider Care Teams.  These Care Teams include your primary Cardiologist (physician) and Advanced Practice Providers (APPs -  Physician Assistants and Nurse Practitioners) who  all work together to provide you with the care you need, when you need it. . Please schedule a follow-up appointment to  see Almyra Deforest, PA in 3 weeks on a day when Dr. [name] is also in the office.  Any Other Special Instructions Will Be Listed Below (If Applicable). none      Hilbert Corrigan, [place]  03/02/2018 11:48 PM    [facility] [place], [place], [place]  [address] Phone: [phone]; Fax: [phone]

## 2018-02-28 NOTE — Patient Instructions (Signed)
Medication Instructions:  Your physician recommends that you continue on your current medications as directed. Please refer to the Current Medication list given to you today.  If you need a refill on your cardiac medications before your next appointment, please call your pharmacy.   Lab work: Your physician recommends that you return for lab work in 2 weeks (Tuesday, November 26): CBC  If you have labs (blood work) drawn today and your tests are completely normal, you will receive your results only by: Marland Kitchen MyChart Message (if you have MyChart) OR . A paper copy in the mail If you have any lab test that is abnormal or we need to change your treatment, we will call you to review the results.  Testing/Procedures: Your physician has requested that you have an echocardiogram. Echocardiography is a painless test that uses sound waves to create images of your heart. It provides your doctor with information about the size and shape of your heart and how well your heart's chambers and valves are working. This procedure takes approximately one hour. There are no restrictions for this procedure.  Follow-Up: At [facility], you and your health needs are our priority.  As part of our continuing mission to provide you with exceptional heart care, we have created designated Provider Care Teams.  These Care Teams include your primary Cardiologist (physician) and Advanced Practice Providers (APPs -  Physician Assistants and Nurse Practitioners) who all work together to provide you with the care you need, when you need it. . Please schedule a follow-up appointment to see Almyra Deforest, PA in 3 weeks on a day when Dr. [name] is also in the office.  Any Other Special Instructions Will Be Listed Below (If Applicable). none

## 2018-03-02 ENCOUNTER — Encounter: Payer: Self-pay | Admitting: Physician Assistant

## 2018-03-08 ENCOUNTER — Ambulatory Visit (HOSPITAL_COMMUNITY): Payer: Medicare Other | Attending: Cardiology

## 2018-03-08 ENCOUNTER — Other Ambulatory Visit: Payer: Self-pay

## 2018-03-08 DIAGNOSIS — I480 Paroxysmal atrial fibrillation: Secondary | ICD-10-CM | POA: Insufficient documentation

## 2018-03-14 DIAGNOSIS — I480 Paroxysmal atrial fibrillation: Secondary | ICD-10-CM | POA: Diagnosis not present

## 2018-03-15 LAB — CBC
HEMATOCRIT: 38.3 % (ref 34.0–46.6)
HEMOGLOBIN: 13.2 g/dL (ref 11.1–15.9)
MCH: 30.9 pg (ref 26.6–33.0)
MCHC: 34.5 g/dL (ref 31.5–35.7)
MCV: 90 fL (ref 79–97)
Platelets: 206 10*3/uL (ref 150–450)
RBC: 4.27 x10E6/uL (ref 3.77–5.28)
RDW: 13 % (ref 12.3–15.4)
WBC: 6.1 10*3/uL (ref 3.4–10.8)

## 2018-03-22 ENCOUNTER — Telehealth: Payer: Self-pay | Admitting: Physician Assistant

## 2018-03-22 ENCOUNTER — Encounter: Payer: Self-pay | Admitting: Physician Assistant

## 2018-03-22 ENCOUNTER — Ambulatory Visit (INDEPENDENT_AMBULATORY_CARE_PROVIDER_SITE_OTHER): Payer: Medicare Other | Admitting: Physician Assistant

## 2018-03-22 VITALS — BP 134/60 | HR 71 | Ht 62.0 in | Wt 150.8 lb

## 2018-03-22 DIAGNOSIS — E785 Hyperlipidemia, unspecified: Secondary | ICD-10-CM | POA: Diagnosis not present

## 2018-03-22 DIAGNOSIS — I10 Essential (primary) hypertension: Secondary | ICD-10-CM

## 2018-03-22 DIAGNOSIS — I4819 Other persistent atrial fibrillation: Secondary | ICD-10-CM

## 2018-03-22 NOTE — Telephone Encounter (Signed)
Updated with daughter on the current treatment options, we both agree to keep her in atrial fibrillation for now. May readdress on next followup to see whether she has any asymptom.

## 2018-03-22 NOTE — Patient Instructions (Signed)
Medication Instructions:  NONE If you need a refill on your cardiac medications before your next appointment, please call your pharmacy.   Lab work: NONE If you have labs (blood work) drawn today and your tests are completely normal, you will receive your results only by: Marland Kitchen MyChart Message (if you have MyChart) OR . A paper copy in the mail If you have any lab test that is abnormal or we need to change your treatment, we will call you to review the results.  Testing/Procedures: NONE  Follow-Up: At [facility], you and your health needs are our priority.  As part of our continuing mission to provide you with exceptional heart care, we have created designated Provider Care Teams.  These Care Teams include your primary Cardiologist (physician) and Advanced Practice Providers (APPs -  Physician Assistants and Nurse Practitioners) who all work together to provide you with the care you need, when you need it. . You have follow up appointment on 05/17/2018 with Dr. [name] at 9:40am.

## 2018-03-22 NOTE — Progress Notes (Signed)
Cardiology Office Note    Date:  03/24/2018   ID:  Cassandra Lang 28-Apr-1924, MRN [id number]  PCP:  Marton Redwood, MD  Cardiologist:  Dr. [name]   Chief Complaint  Patient presents with  . Follow-up    seen for Dr. [name].     History of Present Illness:  Cassandra Lang is a 84 y.o. female with PMH of PACs, HTN, and HLD.  Holter monitor in 2012 showed frequent PACs but no significant arrhythmia.  She had a lower GI bleed secondary to mesenteric ischemia in June 2012.  She also had a event monitor in 2016 that was also normal.  Patient was last seen by Dr. [name] in January 2019, she was doing well at the time.  I last saw the patient on 02/28/2018 after her PCP diagnosed her with new atrial fibrillation with rate controlled rhythm.  She was started on 5 mg twice daily of Eliquis.  Heart rate is very well controlled since she was previously on home beta-blocker even prior to onset of PAF.  Her CHA2DS2-Vasc score is 4.  I plan to bring the patient back in 3 weeks for discussion regarding cardioversion.  Patient presents today for follow-up.  Follow-up CBC was normal.  Patient had echocardiogram on 03/08/2018 which showed EF 60 to 65%, mild aortic regurgitation, moderate MR, peak PA pressure 39 mmHg.  Patient presents today for cardiology office visit.  She has been compliant with her Eliquis twice a day for the past 3 weeks.  She has not had any major bleeding issues.  Her CBC was normal.  She does have occasional dyspnea, however this is chronic for her and likely not related to atrial fibrillation.  She denies any cardiac awareness of atrial fibrillation.  We discussed 2 possibilities, one possibility is she stays in atrial fibrillation as long as her heart rate is controlled and she is on a blood thinner, alternatively we could consider outpatient DC cardioversion.  She wished to have this discussed with her daughter as well.  I will attempt to call her daughter this afternoon to give her update  and inform her treatment options as well.    Past Medical History:  Diagnosis Date  . Arthritis   . Breast cancer ([place])    a. 1999 s/p R partial mastectomy and chemo/radiation.  . Breast mass, left 03/21/2012   a. Benign on excisional biopsy 05/01/12   . Colitis   . Essential hypertension   . Hyperlipidemia   . Palpitations    a. 12/2014 Event Monitor: RSR, no arrhythmias.  . Symptomatic Premature atrial contractions     Past Surgical History:  Procedure Laterality Date  . ABDOMINAL HYSTERECTOMY    . APPENDECTOMY    . BREAST BIOPSY  05/01/2012   Procedure: BREAST BIOPSY WITH NEEDLE LOCALIZATION;  Surgeon: Haywood Lasso, MD;  Location: [place];  Service: General;  Laterality: Left;  needle localization left breast biopsy and left nipple biopsy  . BREAST EXCISIONAL BIOPSY Left 05/01/2012   benign  . BREAST EXCISIONAL BIOPSY Bilateral 03/08/1998   malignant  . BREAST LUMPECTOMY Right 03/08/1998  . CARDIAC CATHETERIZATION     rt eye-cataract  . COLONOSCOPY    . MASTECTOMY PARTIAL / LUMPECTOMY  1999   rt lump-rt axillary dissection-#20  . TONSILLECTOMY    . TUMOR REMOVAL     FROM FALLOPIAN TUBE    Current Medications: Outpatient Medications Prior to Visit  Medication Sig Dispense Refill  .  acetaminophen (TYLENOL) 325 MG tablet Take 650 mg by mouth every 6 (six) hours as needed.      . Calcium Carb-Cholecalciferol (CALCIUM 600/VITAMIN D3 PO) Take 1 tablet by mouth daily.    Marland Kitchen ELIQUIS 5 MG TABS tablet     . fluticasone (FLONASE) 50 MCG/ACT nasal spray Place 2 sprays into both nostrils daily.    . hydrochlorothiazide (HYDRODIURIL) 25 MG tablet Take 1 tablet (25 mg total) by mouth daily. 90 tablet 3  . loratadine (CLARITIN) 10 MG tablet Take 10 mg by mouth daily.    . metoprolol tartrate (LOPRESSOR) 25 MG tablet Take 25 mg by mouth 2 (two) times daily.    . Multiple Vitamins-Minerals (MULTIVITAMIN PO) Take by mouth daily.    Marland Kitchen olmesartan (BENICAR) 20 MG  tablet Take 20 mg by mouth daily.  6  . Probiotic Product (ALIGN PO) Take 1 tablet by mouth daily.    Marland Kitchen aspirin 81 MG tablet Take 81 mg by mouth daily.      Marland Kitchen atorvastatin (LIPITOR) 10 MG tablet Take 10 mg by mouth daily.      . Calcium Carbonate-Vitamin D (CALCIUM + D PO) Take 1 tablet by mouth 2 (two) times daily.     . DiphenhydrAMINE HCl (BENADRYL ALLERGY PO) Take by mouth as needed.       No facility-administered medications prior to visit.      Allergies:   Penicillins   Social History   Socioeconomic History  . Marital status: Widowed    Spouse name: Not on file  . Number of children: 1  . Years of education: Not on file  . Highest education level: Not on file  Occupational History  . Not on file  Social Needs  . Financial resource strain: Not on file  . Food insecurity:    Worry: Not on file    Inability: Not on file  . Transportation needs:    Medical: Not on file    Non-medical: Not on file  Tobacco Use  . Smoking status: Never Smoker  . Smokeless tobacco: Never Used  Substance and Sexual Activity  . Alcohol use: No  . Drug use: No  . Sexual activity: Not on file  Lifestyle  . Physical activity:    Days per week: Not on file    Minutes per session: Not on file  . Stress: Not on file  Relationships  . Social connections:    Talks on phone: Not on file    Gets together: Not on file    Attends religious service: Not on file    Active member of club or organization: Not on file    Attends meetings of clubs or organizations: Not on file    Relationship status: Not on file  Other Topics Concern  . Not on file  Social History Narrative  . Not on file     Family History:  The patient's family history includes Breast cancer in her sister; Kidney disease in her father.   ROS:   Please see the history of present illness.    ROS All other systems reviewed and are negative.   PHYSICAL EXAM:   VS:  BP 134/60   Pulse 71   Ht 5\' 2"  (1.575 m)   Wt 150 lb  12.8 oz (68.4 kg)   BMI 27.58 kg/m    GEN: Well nourished, well developed, in no acute distress  HEENT: normal  Neck: no JVD, carotid bruits, or masses Cardiac: Irregularly irregular; no  murmurs, rubs, or gallops,no edema  Respiratory:  clear to auscultation bilaterally, normal work of breathing GI: soft, nontender, nondistended, + BS MS: no deformity or atrophy  Skin: warm and dry, no rash Neuro:  Alert and Oriented x 3, Strength and sensation are intact Psych: euthymic mood, full affect  Wt Readings from Last 3 Encounters:  03/22/18 150 lb 12.8 oz (68.4 kg)  02/28/18 151 lb 3.2 oz (68.6 kg)  05/09/17 145 lb 12.8 oz (66.1 kg)      Studies/Labs Reviewed:   EKG:  EKG is ordered today.  The ekg ordered today demonstrates atrial fibrillation, heart rate controlled  Recent Labs: 03/14/2018: Hemoglobin 13.2; Platelets 206   Lipid Panel No results found for: CHOL, TRIG, HDL, CHOLHDL, VLDL, LDLCALC, LDLDIRECT  Additional studies/ records that were reviewed today include:   Echo 03/08/2018 LV EF: 60% -   65% Study Conclusions  - Left ventricle: The cavity size was normal. Systolic function was   normal. The estimated ejection fraction was in the range of 60%   to 65%. Wall motion was normal; there were no regional wall   motion abnormalities. - Aortic valve: There was mild regurgitation. - Mitral valve: There was moderate regurgitation. - Tricuspid valve: There was trivial regurgitation. - Pulmonary arteries: Systolic pressure was mildly increased. PA   peak pressure: 39 mm Hg (S).    ASSESSMENT:    1. Persistent atrial fibrillation   2. Essential hypertension   3. Hyperlipidemia, unspecified hyperlipidemia type      PLAN:  In order of problems listed above:  1. Persistent atrial fibrillation: She remains in atrial fibrillation today, heart rate is very well controlled.  Continue on Eliquis.  We discussed 2 options, one of which is for her to remain in rate  controlled atrial fibrillation since she is completely asymptomatic, alternatively, we can consider outpatient DC cardioversion.  She wished to have made discussed with her daughter over the phone regarding the plan.  I will attempt to call her daughter this afternoon.  I have also discussed the case with Dr. [name], given her advanced age and lack of symptom, neither one of [place] feel strongly to convert her.  2. Hypertension: Blood pressure well controlled on current medication.  3. Hyperlipidemia: She is not on any statin.  Although Lipitor is listed as part of her medication, she says she has not been taking the Lipitor.  Will defer annual lipid panel to primary care provider.    Medication Adjustments/Labs and Tests Ordered: Current medicines are reviewed at length with the patient today.  Concerns regarding medicines are outlined above.  Medication changes, Labs and Tests ordered today are listed in the Patient Instructions below. Patient Instructions  Medication Instructions:  NONE If you need a refill on your cardiac medications before your next appointment, please call your pharmacy.   Lab work: NONE If you have labs (blood work) drawn today and your tests are completely normal, you will receive your results only by: Marland Kitchen MyChart Message (if you have MyChart) OR . A paper copy in the mail If you have any lab test that is abnormal or we need to change your treatment, we will call you to review the results.  Testing/Procedures: NONE  Follow-Up: At [facility], you and your health needs are our priority.  As part of our continuing mission to provide you with exceptional heart care, we have created designated Provider Care Teams.  These Care Teams include your primary Cardiologist (physician) and Advanced Practice  Providers (APPs -  Physician Assistants and Nurse Practitioners) who all work together to provide you with the care you need, when you need it. . You have follow up appointment  on 05/17/2018 with Dr. [name] at 9:40am.      Signed, Almyra Deforest, [place]  03/24/2018 11:32 PM    [address], [place], [place]  [address] Phone: [phone]; Fax: [phone]

## 2018-03-24 ENCOUNTER — Encounter: Payer: Self-pay | Admitting: Physician Assistant

## 2018-04-18 ENCOUNTER — Inpatient Hospital Stay (HOSPITAL_COMMUNITY)
Admission: EM | Admit: 2018-04-18 | Discharge: 2018-05-20 | DRG: 025 | Disposition: E | Payer: Medicare Other | Attending: Neurological Surgery | Admitting: Neurological Surgery

## 2018-04-18 ENCOUNTER — Encounter (HOSPITAL_COMMUNITY): Payer: Self-pay | Admitting: Emergency Medicine

## 2018-04-18 ENCOUNTER — Emergency Department (HOSPITAL_COMMUNITY): Payer: Medicare Other

## 2018-04-18 DIAGNOSIS — Z9011 Acquired absence of right breast and nipple: Secondary | ICD-10-CM

## 2018-04-18 DIAGNOSIS — G92 Toxic encephalopathy: Secondary | ICD-10-CM | POA: Diagnosis present

## 2018-04-18 DIAGNOSIS — Z515 Encounter for palliative care: Secondary | ICD-10-CM | POA: Diagnosis present

## 2018-04-18 DIAGNOSIS — Z79899 Other long term (current) drug therapy: Secondary | ICD-10-CM

## 2018-04-18 DIAGNOSIS — Z923 Personal history of irradiation: Secondary | ICD-10-CM

## 2018-04-18 DIAGNOSIS — I6200 Nontraumatic subdural hemorrhage, unspecified: Secondary | ICD-10-CM | POA: Diagnosis not present

## 2018-04-18 DIAGNOSIS — G9389 Other specified disorders of brain: Secondary | ICD-10-CM | POA: Diagnosis present

## 2018-04-18 DIAGNOSIS — Z9221 Personal history of antineoplastic chemotherapy: Secondary | ICD-10-CM

## 2018-04-18 DIAGNOSIS — G8190 Hemiplegia, unspecified affecting unspecified side: Secondary | ICD-10-CM | POA: Diagnosis not present

## 2018-04-18 DIAGNOSIS — R4020 Unspecified coma: Secondary | ICD-10-CM | POA: Diagnosis not present

## 2018-04-18 DIAGNOSIS — I4891 Unspecified atrial fibrillation: Secondary | ICD-10-CM | POA: Diagnosis not present

## 2018-04-18 DIAGNOSIS — E871 Hypo-osmolality and hyponatremia: Secondary | ICD-10-CM | POA: Diagnosis not present

## 2018-04-18 DIAGNOSIS — S0101XA Laceration without foreign body of scalp, initial encounter: Secondary | ICD-10-CM | POA: Diagnosis not present

## 2018-04-18 DIAGNOSIS — Z781 Physical restraint status: Secondary | ICD-10-CM

## 2018-04-18 DIAGNOSIS — R9431 Abnormal electrocardiogram [ECG] [EKG]: Secondary | ICD-10-CM | POA: Diagnosis not present

## 2018-04-18 DIAGNOSIS — R4182 Altered mental status, unspecified: Secondary | ICD-10-CM | POA: Diagnosis not present

## 2018-04-18 DIAGNOSIS — Z803 Family history of malignant neoplasm of breast: Secondary | ICD-10-CM

## 2018-04-18 DIAGNOSIS — G935 Compression of brain: Secondary | ICD-10-CM | POA: Diagnosis not present

## 2018-04-18 DIAGNOSIS — Y92009 Unspecified place in unspecified non-institutional (private) residence as the place of occurrence of the external cause: Secondary | ICD-10-CM

## 2018-04-18 DIAGNOSIS — Z66 Do not resuscitate: Secondary | ICD-10-CM | POA: Diagnosis present

## 2018-04-18 DIAGNOSIS — R404 Transient alteration of awareness: Secondary | ICD-10-CM | POA: Diagnosis not present

## 2018-04-18 DIAGNOSIS — R402222 Coma scale, best verbal response, incomprehensible words, at arrival to emergency department: Secondary | ICD-10-CM | POA: Diagnosis present

## 2018-04-18 DIAGNOSIS — R739 Hyperglycemia, unspecified: Secondary | ICD-10-CM | POA: Diagnosis not present

## 2018-04-18 DIAGNOSIS — S065X9A Traumatic subdural hemorrhage with loss of consciousness of unspecified duration, initial encounter: Principal | ICD-10-CM | POA: Diagnosis present

## 2018-04-18 DIAGNOSIS — I10 Essential (primary) hypertension: Secondary | ICD-10-CM | POA: Diagnosis present

## 2018-04-18 DIAGNOSIS — Z853 Personal history of malignant neoplasm of breast: Secondary | ICD-10-CM

## 2018-04-18 DIAGNOSIS — I480 Paroxysmal atrial fibrillation: Secondary | ICD-10-CM | POA: Diagnosis present

## 2018-04-18 DIAGNOSIS — R402352 Coma scale, best motor response, localizes pain, at arrival to emergency department: Secondary | ICD-10-CM | POA: Diagnosis present

## 2018-04-18 DIAGNOSIS — R402122 Coma scale, eyes open, to pain, at arrival to emergency department: Secondary | ICD-10-CM | POA: Diagnosis present

## 2018-04-18 DIAGNOSIS — R52 Pain, unspecified: Secondary | ICD-10-CM | POA: Diagnosis not present

## 2018-04-18 DIAGNOSIS — S065XAA Traumatic subdural hemorrhage with loss of consciousness status unknown, initial encounter: Secondary | ICD-10-CM | POA: Diagnosis present

## 2018-04-18 DIAGNOSIS — E785 Hyperlipidemia, unspecified: Secondary | ICD-10-CM | POA: Diagnosis present

## 2018-04-18 DIAGNOSIS — Z7901 Long term (current) use of anticoagulants: Secondary | ICD-10-CM

## 2018-04-18 DIAGNOSIS — X58XXXA Exposure to other specified factors, initial encounter: Secondary | ICD-10-CM | POA: Diagnosis present

## 2018-04-18 HISTORY — DX: Unspecified atrial fibrillation: I48.91

## 2018-04-18 MED ORDER — CLEVIDIPINE BUTYRATE 0.5 MG/ML IV EMUL
0.0000 mg/h | INTRAVENOUS | Status: DC
  Administered 2018-04-19: 1 mg/h via INTRAVENOUS
  Filled 2018-04-18: qty 50

## 2018-04-18 MED ORDER — PROTHROMBIN COMPLEX CONC HUMAN 500 UNITS IV KIT
3858.0000 [IU] | PACK | Status: AC
  Administered 2018-04-19: 3858 [IU] via INTRAVENOUS
  Filled 2018-04-18: qty 3358

## 2018-04-18 NOTE — ED Notes (Signed)
Patient transported to CT SCAN . 

## 2018-04-18 NOTE — ED Triage Notes (Signed)
Patient arrived with EMS from home found unresponsive/somnolent by daughter on the recliner this evening , pt. received Zofran 4 mg IV by EMS for emesis , presents with posterior scalp laceration with minimal bleeding approx. 1", LSN 7pm yesterday ( daughter spoke with pt. on the phone ).

## 2018-04-18 NOTE — ED Provider Notes (Signed)
[facility] Provider Note  CSN: [id number] Arrival date & time: 04/10/2018 2253  Chief Complaint(s) Altered Mental Status  Patient arrived with EMS from home found unresponsive/somnolent by daughter on the recliner this evening , pt. received Zofran 4 mg IV by EMS for emesis , presents with posterior scalp laceration with minimal bleeding approx. 1", LSN 7pm yesterday ( daughter spoke with pt. on the phone ). HPI [facility] is a 84 y.o. female who presents with altered mental status and unresponsiveness.  Last known normal more than 24 hours ago.  Remainder of history, ROS, and physical exam limited due to patient's condition (AMS). Additional information was obtained from EMS.   Level V Caveat.    HPI  Past Medical History Past Medical History:  Diagnosis Date  . A-fib ([place])   . Arthritis   . Breast cancer ([place])    a. 1999 s/p R partial mastectomy and chemo/radiation.  . Breast mass, left 03/21/2012   a. Benign on excisional biopsy 05/01/12   . Colitis   . Essential hypertension   . Hyperlipidemia   . Palpitations    a. 12/2014 Event Monitor: RSR, no arrhythmias.  . Symptomatic Premature atrial contractions    Patient Active Problem List   Diagnosis Date Noted  . Essential hypertension   . Symptomatic Premature atrial contractions   . PAC (premature atrial contraction) 01/06/2012  . Hypertension   . Hyperlipidemia   . Breast cancer ([place])   . Palpitations    Home Medication(s) Prior to Admission medications   Medication Sig Start Date End Date Taking? Authorizing Provider  acetaminophen (TYLENOL) 325 MG tablet Take 650 mg by mouth every 6 (six) hours as needed.      [provider]  Calcium Carb-Cholecalciferol (CALCIUM 600/VITAMIN D3 PO) Take 1 tablet by mouth daily.    [provider]  ELIQUIS 5 MG TABS tablet  02/27/18   [provider]  fluticasone (FLONASE) 50 MCG/ACT nasal spray Place 2 sprays  into both nostrils daily. 02/04/14   [provider]  hydrochlorothiazide (HYDRODIURIL) 25 MG tablet Take 1 tablet (25 mg total) by mouth daily. 04/15/15   [name], [name], MD  loratadine (CLARITIN) 10 MG tablet Take 10 mg by mouth daily.    [provider]  metoprolol tartrate (LOPRESSOR) 25 MG tablet Take 25 mg by mouth 2 (two) times daily.    [provider]  Multiple Vitamins-Minerals (MULTIVITAMIN PO) Take by mouth daily.    [provider]  olmesartan (BENICAR) 20 MG tablet Take 20 mg by mouth daily. 05/01/17   [provider]  Probiotic Product (ALIGN PO) Take 1 tablet by mouth daily.    [provider]                                                                                                                                    Past  Surgical History Past Surgical History:  Procedure Laterality Date  . ABDOMINAL HYSTERECTOMY    . APPENDECTOMY    . BREAST BIOPSY  05/01/2012   Procedure: BREAST BIOPSY WITH NEEDLE LOCALIZATION;  Surgeon: Haywood Lasso, MD;  Location: [facility];  Service: General;  Laterality: Left;  needle localization left breast biopsy and left nipple biopsy  . BREAST EXCISIONAL BIOPSY Left 05/01/2012   benign  . BREAST EXCISIONAL BIOPSY Bilateral 03/08/1998   malignant  . BREAST LUMPECTOMY Right 03/08/1998  . CARDIAC CATHETERIZATION     rt eye-cataract  . COLONOSCOPY    . MASTECTOMY PARTIAL / LUMPECTOMY  1999   rt lump-rt axillary dissection-#20  . TONSILLECTOMY    . TUMOR REMOVAL     FROM FALLOPIAN TUBE   Family History Family History  Problem Relation Age of Onset  . Kidney disease Father   . Breast cancer Sister     Social History Social History   Tobacco Use  . Smoking status: Never Smoker  . Smokeless tobacco: Never Used  Substance Use Topics  . Alcohol use: No  . Drug use: No   Allergies Penicillins  Review of Systems Review of Systems  Unable to perform ROS:  Mental status change    Physical Exam Vital Signs  I have reviewed the triage vital signs BP (!) 162/64 (BP Location: Right Arm)   Pulse 72   Temp (!) 96.9 F (36.1 C) (Rectal)   Resp 19   SpO2 93%   Physical Exam Vitals signs reviewed.  Constitutional:      General: She is not in acute distress.    Appearance: She is well-developed. She is not diaphoretic.  HENT:     Head: Normocephalic. Laceration present.      Nose: Nose normal.  Eyes:     General: No scleral icterus.       Right eye: No discharge.        Left eye: No discharge.     Conjunctiva/sclera: Conjunctivae normal.     Pupils: Pupils are equal, round, and reactive to light.  Neck:     Musculoskeletal: Normal range of motion and neck supple.  Cardiovascular:     Rate and Rhythm: Normal rate. Rhythm irregularly irregular.     Heart sounds: No murmur. No friction rub. No gallop.   Pulmonary:     Effort: Pulmonary effort is normal. No respiratory distress.     Breath sounds: Normal breath sounds. No stridor. No rales.  Abdominal:     General: There is no distension.     Palpations: Abdomen is soft.     Tenderness: There is no abdominal tenderness.  Musculoskeletal:        General: No tenderness.  Skin:    General: Skin is warm and dry.     Findings: No erythema or rash.  Neurological:     Mental Status: She is unresponsive.     GCS: GCS eye subscore is 2. GCS verbal subscore is 2. GCS motor subscore is 5.     Comments: Left facial droop noted with wincing to pain. Moves all extremities.     ED Results and Treatments Labs (all labs ordered are listed, but only abnormal results are displayed) Labs Reviewed  COMPREHENSIVE METABOLIC PANEL - Abnormal; Notable for the following components:      Result Value   Sodium 128 (*)    Chloride 90 (*)    Glucose, Bld 190 (*)    Total Protein 6.1 (*)  GFR calc non Af Amer 52 (*)    All other components within normal limits  CBC WITH DIFFERENTIAL/PLATELET -  Abnormal; Notable for the following components:   WBC 16.6 (*)    Neutro Abs 15.0 (*)    All other components within normal limits  URINALYSIS, COMPLETE (UACMP) WITH MICROSCOPIC - Abnormal; Notable for the following components:   Ketones, ur 5 (*)    All other components within normal limits  PROTIME-INR - Abnormal; Notable for the following components:   Prothrombin Time 15.4 (*)    All other components within normal limits  CBG MONITORING, ED - Abnormal; Notable for the following components:   Glucose-Capillary 172 (*)    All other components within normal limits  I-STAT CHEM 8, ED - Abnormal; Notable for the following components:   Sodium 126 (*)    Chloride 89 (*)    Glucose, Bld 185 (*)    All other components within normal limits  I-STAT CG4 LACTIC ACID, ED - Abnormal; Notable for the following components:   Lactic Acid, Venous 2.08 (*)    All other components within normal limits  ETHANOL  AMMONIA                                                                                                                         EKG  EKG Interpretation  Date/Time:  Tuesday April 18 2018 23:00:08 EST Ventricular Rate:  81 PR Interval:    QRS Duration: 104 QT Interval:  439 QTC Calculation: 510 R Axis:   91 Text Interpretation:  Atrial fibrillation Right axis deviation Borderline T wave abnormalities Prolonged QT interval NO STEMI Confirmed by Addison Lank [phone]) on 03/23/2018 11:23:54 PM      Radiology Ct Head Wo Contrast  Result Date: 05/07/2018 CLINICAL DATA:  Found unresponsive EXAM: CT HEAD WITHOUT CONTRAST CT CERVICAL SPINE WITHOUT CONTRAST TECHNIQUE: Multidetector CT imaging of the head and cervical spine was performed following the standard protocol without intravenous contrast. Multiplanar CT image reconstructions of the cervical spine were also generated. COMPARISON:  None. FINDINGS: CT HEAD FINDINGS Brain: There is a large, acute left convexity subdural hematoma  measuring up to 2.8 cm in thickness. There is marked right midline shift that measures 1.4 cm. There is rightward bulging of the falx cerebri but no frank herniation. There is narrowing of the basal cisterns, which remain patent. No hydrocephalus. Vascular: No abnormal hyperdensity of the major intracranial arteries or dural venous sinuses. No intracranial atherosclerosis. Skull: Large left parietal scalp laceration and hematoma. No skull fracture. Sinuses/Orbits: No fluid levels or advanced mucosal thickening of the visualized paranasal sinuses. No mastoid or middle ear effusion. The orbits are normal. CT CERVICAL SPINE FINDINGS Motion degradation. Alignment: No static subluxation. Facets are aligned. Occipital condyles are normally positioned. Skull base and vertebrae: No acute fracture. Soft tissues and spinal canal: No prevertebral fluid or swelling. No visible canal hematoma. Disc levels: No advanced spinal canal or neural foraminal stenosis. Upper chest:  No pneumothorax, pulmonary nodule or pleural effusion. Other: Normal visualized paraspinal cervical soft tissues. IMPRESSION: 1. Large, acute left subdural hematoma measuring up to 2.8 cm in thickness with 14 mm rightward midline shift. 2. Large left parietal scalp laceration and hematoma without skull fracture. 3. No acute fracture or static subluxation of the cervical spine, allowing for moderate motion degradation. Critical Value/emergent results were called by telephone at the time of interpretation on 04/26/2018 at 12:02 am to Dr. Addison Lank , who verbally acknowledged these results. Electronically Signed   By: Ulyses Jarred M.D.   On: 05/17/2018 00:09   Ct Cervical Spine Wo Contrast  Result Date: 04/24/2018 CLINICAL DATA:  Found unresponsive EXAM: CT HEAD WITHOUT CONTRAST CT CERVICAL SPINE WITHOUT CONTRAST TECHNIQUE: Multidetector CT imaging of the head and cervical spine was performed following the standard protocol without intravenous contrast.  Multiplanar CT image reconstructions of the cervical spine were also generated. COMPARISON:  None. FINDINGS: CT HEAD FINDINGS Brain: There is a large, acute left convexity subdural hematoma measuring up to 2.8 cm in thickness. There is marked right midline shift that measures 1.4 cm. There is rightward bulging of the falx cerebri but no frank herniation. There is narrowing of the basal cisterns, which remain patent. No hydrocephalus. Vascular: No abnormal hyperdensity of the major intracranial arteries or dural venous sinuses. No intracranial atherosclerosis. Skull: Large left parietal scalp laceration and hematoma. No skull fracture. Sinuses/Orbits: No fluid levels or advanced mucosal thickening of the visualized paranasal sinuses. No mastoid or middle ear effusion. The orbits are normal. CT CERVICAL SPINE FINDINGS Motion degradation. Alignment: No static subluxation. Facets are aligned. Occipital condyles are normally positioned. Skull base and vertebrae: No acute fracture. Soft tissues and spinal canal: No prevertebral fluid or swelling. No visible canal hematoma. Disc levels: No advanced spinal canal or neural foraminal stenosis. Upper chest: No pneumothorax, pulmonary nodule or pleural effusion. Other: Normal visualized paraspinal cervical soft tissues. IMPRESSION: 1. Large, acute left subdural hematoma measuring up to 2.8 cm in thickness with 14 mm rightward midline shift. 2. Large left parietal scalp laceration and hematoma without skull fracture. 3. No acute fracture or static subluxation of the cervical spine, allowing for moderate motion degradation. Critical Value/emergent results were called by telephone at the time of interpretation on 05/07/2018 at 12:02 am to Dr. Addison Lank , who verbally acknowledged these results. Electronically Signed   By: Ulyses Jarred M.D.   On: 04/26/2018 00:09   Pertinent labs & imaging results that were available during my care of the patient were reviewed by me and  considered in my medical decision making (see chart for details).  Medications Ordered in ED Medications  clevidipine (CLEVIPREX) infusion 0.5 mg/mL (4 mg/hr Intravenous Rate/Dose Change 05/08/2018 0111)  prothrombin complex conc human (KCENTRA) IVPB 3,858 Units (0 Units Intravenous Stopped 05/07/2018 0043)  ondansetron (ZOFRAN) injection 4 mg (4 mg Intravenous Given 05/01/2018 0119)  Procedures .Critical Care Performed by: Fatima Blank, MD Authorized by: Fatima Blank, MD   Critical care provider statement:    Critical care time (minutes):  60   Critical care start time:  03/29/2018 11:10 PM   Critical care end time:  04/25/2018 1:38 AM   Critical care was necessary to treat or prevent imminent or life-threatening deterioration of the following conditions:  CNS failure or compromise   Critical care was time spent personally by me on the following activities:  Discussions with consultants, evaluation of patient's response to treatment, examination of patient, ordering and performing treatments and interventions, ordering and review of laboratory studies, ordering and review of radiographic studies, pulse oximetry, re-evaluation of patient's condition, obtaining history from patient or surrogate and review of old charts    (including critical care time)  Medical Decision Making / ED Course I have reviewed the nursing notes for this encounter and the patient's prior records (if available in EHR or on provided paperwork).    Work-up notable for left SDH with severe shift.  No evidence of herniation at this time. Reversal with KCentra and blood pressure control meds ordered.  Spoke with the daughter, Annamary Rummage who reported the patient would be amicable to life-threatening surgeries and intubation.   BP controled.   NSU consulted who evaluated in the  ED and took patient to OR for emergent management.      Final Clinical Impression(s) / ED Diagnoses Final diagnoses:  SDH (subdural hematoma) ([place])      This chart was dictated using voice recognition software.  Despite best efforts to proofread,  errors can occur which can change the documentation meaning.   Fatima Blank, MD 04/25/2018 [phone]

## 2018-04-19 ENCOUNTER — Inpatient Hospital Stay (HOSPITAL_COMMUNITY): Payer: Medicare Other

## 2018-04-19 ENCOUNTER — Encounter (HOSPITAL_COMMUNITY): Payer: Self-pay | Admitting: Anesthesiology

## 2018-04-19 ENCOUNTER — Encounter (HOSPITAL_COMMUNITY): Admission: EM | Disposition: E | Payer: Self-pay | Source: Home / Self Care | Attending: Neurological Surgery

## 2018-04-19 ENCOUNTER — Emergency Department (HOSPITAL_COMMUNITY): Payer: Medicare Other | Admitting: Anesthesiology

## 2018-04-19 DIAGNOSIS — G8190 Hemiplegia, unspecified affecting unspecified side: Secondary | ICD-10-CM | POA: Diagnosis present

## 2018-04-19 DIAGNOSIS — Z515 Encounter for palliative care: Secondary | ICD-10-CM | POA: Diagnosis present

## 2018-04-19 DIAGNOSIS — G9389 Other specified disorders of brain: Secondary | ICD-10-CM | POA: Diagnosis present

## 2018-04-19 DIAGNOSIS — R739 Hyperglycemia, unspecified: Secondary | ICD-10-CM | POA: Diagnosis not present

## 2018-04-19 DIAGNOSIS — R401 Stupor: Secondary | ICD-10-CM | POA: Diagnosis not present

## 2018-04-19 DIAGNOSIS — S065X9A Traumatic subdural hemorrhage with loss of consciousness of unspecified duration, initial encounter: Secondary | ICD-10-CM | POA: Diagnosis present

## 2018-04-19 DIAGNOSIS — E785 Hyperlipidemia, unspecified: Secondary | ICD-10-CM | POA: Diagnosis present

## 2018-04-19 DIAGNOSIS — Z9221 Personal history of antineoplastic chemotherapy: Secondary | ICD-10-CM | POA: Diagnosis not present

## 2018-04-19 DIAGNOSIS — Z853 Personal history of malignant neoplasm of breast: Secondary | ICD-10-CM | POA: Diagnosis not present

## 2018-04-19 DIAGNOSIS — I6200 Nontraumatic subdural hemorrhage, unspecified: Secondary | ICD-10-CM | POA: Diagnosis not present

## 2018-04-19 DIAGNOSIS — X58XXXA Exposure to other specified factors, initial encounter: Secondary | ICD-10-CM | POA: Diagnosis present

## 2018-04-19 DIAGNOSIS — I480 Paroxysmal atrial fibrillation: Secondary | ICD-10-CM | POA: Diagnosis present

## 2018-04-19 DIAGNOSIS — E871 Hypo-osmolality and hyponatremia: Secondary | ICD-10-CM | POA: Diagnosis present

## 2018-04-19 DIAGNOSIS — Z7901 Long term (current) use of anticoagulants: Secondary | ICD-10-CM | POA: Diagnosis not present

## 2018-04-19 DIAGNOSIS — I609 Nontraumatic subarachnoid hemorrhage, unspecified: Secondary | ICD-10-CM | POA: Diagnosis not present

## 2018-04-19 DIAGNOSIS — I10 Essential (primary) hypertension: Secondary | ICD-10-CM | POA: Diagnosis present

## 2018-04-19 DIAGNOSIS — Z9011 Acquired absence of right breast and nipple: Secondary | ICD-10-CM | POA: Diagnosis not present

## 2018-04-19 DIAGNOSIS — R569 Unspecified convulsions: Secondary | ICD-10-CM | POA: Diagnosis not present

## 2018-04-19 DIAGNOSIS — S065X0A Traumatic subdural hemorrhage without loss of consciousness, initial encounter: Secondary | ICD-10-CM | POA: Diagnosis not present

## 2018-04-19 DIAGNOSIS — Z781 Physical restraint status: Secondary | ICD-10-CM | POA: Diagnosis not present

## 2018-04-19 DIAGNOSIS — G935 Compression of brain: Secondary | ICD-10-CM | POA: Diagnosis present

## 2018-04-19 DIAGNOSIS — Y92009 Unspecified place in unspecified non-institutional (private) residence as the place of occurrence of the external cause: Secondary | ICD-10-CM | POA: Diagnosis not present

## 2018-04-19 DIAGNOSIS — G92 Toxic encephalopathy: Secondary | ICD-10-CM | POA: Diagnosis present

## 2018-04-19 DIAGNOSIS — Z66 Do not resuscitate: Secondary | ICD-10-CM | POA: Diagnosis present

## 2018-04-19 DIAGNOSIS — R402222 Coma scale, best verbal response, incomprehensible words, at arrival to emergency department: Secondary | ICD-10-CM | POA: Diagnosis present

## 2018-04-19 DIAGNOSIS — R402122 Coma scale, eyes open, to pain, at arrival to emergency department: Secondary | ICD-10-CM | POA: Diagnosis present

## 2018-04-19 DIAGNOSIS — G9340 Encephalopathy, unspecified: Secondary | ICD-10-CM | POA: Diagnosis not present

## 2018-04-19 DIAGNOSIS — Z803 Family history of malignant neoplasm of breast: Secondary | ICD-10-CM | POA: Diagnosis not present

## 2018-04-19 DIAGNOSIS — I6201 Nontraumatic acute subdural hemorrhage: Secondary | ICD-10-CM | POA: Diagnosis not present

## 2018-04-19 DIAGNOSIS — Z79899 Other long term (current) drug therapy: Secondary | ICD-10-CM | POA: Diagnosis not present

## 2018-04-19 DIAGNOSIS — S065XAA Traumatic subdural hemorrhage with loss of consciousness status unknown, initial encounter: Secondary | ICD-10-CM | POA: Diagnosis present

## 2018-04-19 DIAGNOSIS — R402352 Coma scale, best motor response, localizes pain, at arrival to emergency department: Secondary | ICD-10-CM | POA: Diagnosis present

## 2018-04-19 DIAGNOSIS — Z923 Personal history of irradiation: Secondary | ICD-10-CM | POA: Diagnosis not present

## 2018-04-19 HISTORY — PX: CRANIOTOMY: SHX93

## 2018-04-19 LAB — COMPREHENSIVE METABOLIC PANEL
ALBUMIN: 3.6 g/dL (ref 3.5–5.0)
ALK PHOS: 66 U/L (ref 38–126)
ALT: 38 U/L (ref 0–44)
AST: 34 U/L (ref 15–41)
Anion gap: 12 (ref 5–15)
BUN: 15 mg/dL (ref 8–23)
CALCIUM: 9.1 mg/dL (ref 8.9–10.3)
CHLORIDE: 90 mmol/L — AB (ref 98–111)
CO2: 26 mmol/L (ref 22–32)
CREATININE: 0.95 mg/dL (ref 0.44–1.00)
GFR calc Af Amer: >60 mL/min (ref >60)
GFR calc non Af Amer: 52 mL/min — ABNORMAL LOW (ref >60)
GLUCOSE: 190 mg/dL — AB (ref 70–99)
Potassium: 3.6 mmol/L (ref 3.5–5.1)
SODIUM: 128 mmol/L — AB (ref 135–145)
Total Bilirubin: 0.7 mg/dL (ref 0.3–1.2)
Total Protein: 6.1 g/dL — ABNORMAL LOW (ref 6.5–8.1)

## 2018-04-19 LAB — I-STAT CHEM 8, ED
BUN: 18 mg/dL (ref 8–23)
Calcium, Ion: 1.15 mmol/L (ref 1.15–1.40)
Chloride: 89 mmol/L — ABNORMAL LOW (ref 98–111)
Creatinine, Ser: 0.9 mg/dL (ref 0.44–1.00)
GLUCOSE: 185 mg/dL — AB (ref 70–99)
HCT: 42 % (ref 36.0–46.0)
Hemoglobin: 14.3 g/dL (ref 12.0–15.0)
Potassium: 3.6 mmol/L (ref 3.5–5.1)
Sodium: 126 mmol/L — ABNORMAL LOW (ref 135–145)
TCO2: 26 mmol/L (ref 22–32)

## 2018-04-19 LAB — CBC WITH DIFFERENTIAL/PLATELET
Abs Immature Granulocytes: 0.07 10*3/uL (ref 0.00–0.07)
BASOS ABS: 0.1 10*3/uL (ref 0.0–0.1)
Basophils Relative: 0 %
EOS ABS: 0 10*3/uL (ref 0.0–0.5)
EOS PCT: 0 %
HCT: 38.8 % (ref 36.0–46.0)
HEMOGLOBIN: 13.1 g/dL (ref 12.0–15.0)
IMMATURE GRANULOCYTES: 0 %
LYMPHS PCT: 4 %
Lymphs Abs: 0.7 10*3/uL (ref 0.7–4.0)
MCH: 29.7 pg (ref 26.0–34.0)
MCHC: 33.8 g/dL (ref 30.0–36.0)
MCV: 88 fL (ref 80.0–100.0)
MONOS PCT: 5 %
Monocytes Absolute: 0.8 10*3/uL (ref 0.1–1.0)
NRBC: 0 % (ref 0.0–0.2)
Neutro Abs: 15 10*3/uL — ABNORMAL HIGH (ref 1.7–7.7)
Neutrophils Relative %: 91 %
Platelets: 264 10*3/uL (ref 150–400)
RBC: 4.41 MIL/uL (ref 3.87–5.11)
RDW: 12.4 % (ref 11.5–15.5)
WBC: 16.6 10*3/uL — ABNORMAL HIGH (ref 4.0–10.5)

## 2018-04-19 LAB — I-STAT CG4 LACTIC ACID, ED: Lactic Acid, Venous: 2.08 mmol/L (ref 0.5–1.9)

## 2018-04-19 LAB — PROTIME-INR
INR: 1.23
Prothrombin Time: 15.4 seconds — ABNORMAL HIGH (ref 11.4–15.2)

## 2018-04-19 LAB — URINALYSIS, COMPLETE (UACMP) WITH MICROSCOPIC
BILIRUBIN URINE: NEGATIVE
Bacteria, UA: NONE SEEN
Glucose, UA: NEGATIVE mg/dL
Hgb urine dipstick: NEGATIVE
KETONES UR: 5 mg/dL — AB
LEUKOCYTES UA: NEGATIVE
NITRITE: NEGATIVE
PROTEIN: NEGATIVE mg/dL
Specific Gravity, Urine: 1.012 (ref 1.005–1.030)
pH: 7 (ref 5.0–8.0)

## 2018-04-19 LAB — TYPE AND SCREEN
ABO/RH(D): A POS
Antibody Screen: NEGATIVE

## 2018-04-19 LAB — MRSA PCR SCREENING: MRSA by PCR: NEGATIVE

## 2018-04-19 LAB — TRIGLYCERIDES: Triglycerides: 68 mg/dL (ref <150)

## 2018-04-19 LAB — CBG MONITORING, ED: Glucose-Capillary: 172 mg/dL — ABNORMAL HIGH (ref 70–99)

## 2018-04-19 LAB — ETHANOL: Alcohol, Ethyl (B): <10 mg/dL (ref <10)

## 2018-04-19 SURGERY — CRANIOTOMY HEMATOMA EVACUATION SUBDURAL
Anesthesia: General | Site: Head | Laterality: Left

## 2018-04-19 MED ORDER — FENTANYL CITRATE (PF) 100 MCG/2ML IJ SOLN
50.0000 ug | Freq: Once | INTRAMUSCULAR | Status: AC
  Administered 2018-04-19: 50 ug via INTRAVENOUS

## 2018-04-19 MED ORDER — THROMBIN 5000 UNITS EX SOLR
OROMUCOSAL | Status: DC | PRN
  Administered 2018-04-19: 5 mL via TOPICAL

## 2018-04-19 MED ORDER — ORAL CARE MOUTH RINSE
15.0000 mL | OROMUCOSAL | Status: DC
  Administered 2018-04-19 (×4): 15 mL via OROMUCOSAL

## 2018-04-19 MED ORDER — ONDANSETRON HCL 4 MG/2ML IJ SOLN
4.0000 mg | INTRAMUSCULAR | Status: DC | PRN
  Administered 2018-04-24: 4 mg via INTRAVENOUS
  Filled 2018-04-19: qty 2

## 2018-04-19 MED ORDER — ESMOLOL HCL 100 MG/10ML IV SOLN
INTRAVENOUS | Status: DC | PRN
  Administered 2018-04-19: 30 mg via INTRAVENOUS

## 2018-04-19 MED ORDER — ORAL CARE MOUTH RINSE
15.0000 mL | Freq: Two times a day (BID) | OROMUCOSAL | Status: DC
  Administered 2018-04-20 – 2018-04-25 (×12): 15 mL via OROMUCOSAL

## 2018-04-19 MED ORDER — THROMBIN 20000 UNITS EX SOLR
CUTANEOUS | Status: DC | PRN
  Administered 2018-04-19: 20 mL via TOPICAL

## 2018-04-19 MED ORDER — LABETALOL HCL 5 MG/ML IV SOLN
INTRAVENOUS | Status: AC
  Filled 2018-04-19: qty 4

## 2018-04-19 MED ORDER — SUGAMMADEX SODIUM 200 MG/2ML IV SOLN
INTRAVENOUS | Status: DC | PRN
  Administered 2018-04-19: 200 mg via INTRAVENOUS

## 2018-04-19 MED ORDER — EPHEDRINE 5 MG/ML INJ
INTRAVENOUS | Status: AC
  Filled 2018-04-19: qty 10

## 2018-04-19 MED ORDER — 0.9 % SODIUM CHLORIDE (POUR BTL) OPTIME
TOPICAL | Status: DC | PRN
  Administered 2018-04-19 (×2): 1000 mL

## 2018-04-19 MED ORDER — SODIUM CHLORIDE 0.9 % IV SOLN
INTRAVENOUS | Status: DC | PRN
  Administered 2018-04-19 (×2): via INTRAVENOUS

## 2018-04-19 MED ORDER — FENTANYL 2500MCG IN NS 250ML (10MCG/ML) PREMIX INFUSION
25.0000 ug/h | INTRAVENOUS | Status: DC
  Administered 2018-04-19: 25 ug/h via INTRAVENOUS
  Filled 2018-04-19: qty 250

## 2018-04-19 MED ORDER — BACITRACIN ZINC 500 UNIT/GM EX OINT
TOPICAL_OINTMENT | CUTANEOUS | Status: AC
  Filled 2018-04-19: qty 28.35

## 2018-04-19 MED ORDER — FENTANYL CITRATE (PF) 250 MCG/5ML IJ SOLN
INTRAMUSCULAR | Status: AC
  Filled 2018-04-19: qty 5

## 2018-04-19 MED ORDER — FENTANYL BOLUS VIA INFUSION
25.0000 ug | INTRAVENOUS | Status: DC | PRN
  Filled 2018-04-19: qty 25

## 2018-04-19 MED ORDER — PHENYLEPHRINE 40 MCG/ML (10ML) SYRINGE FOR IV PUSH (FOR BLOOD PRESSURE SUPPORT)
PREFILLED_SYRINGE | INTRAVENOUS | Status: AC
  Filled 2018-04-19: qty 10

## 2018-04-19 MED ORDER — PROPOFOL 10 MG/ML IV BOLUS
INTRAVENOUS | Status: DC | PRN
  Administered 2018-04-19: 50 mg via INTRAVENOUS

## 2018-04-19 MED ORDER — ACETAMINOPHEN 325 MG PO TABS
650.0000 mg | ORAL_TABLET | ORAL | Status: DC | PRN
  Administered 2018-04-23: 650 mg via ORAL
  Filled 2018-04-19: qty 2

## 2018-04-19 MED ORDER — LIDOCAINE HCL (CARDIAC) PF 100 MG/5ML IV SOSY
PREFILLED_SYRINGE | INTRAVENOUS | Status: DC | PRN
  Administered 2018-04-19: 40 mg via INTRATRACHEAL

## 2018-04-19 MED ORDER — ONDANSETRON HCL 4 MG/2ML IJ SOLN
INTRAMUSCULAR | Status: AC
  Filled 2018-04-19: qty 2

## 2018-04-19 MED ORDER — VANCOMYCIN HCL 1000 MG IV SOLR
INTRAVENOUS | Status: DC | PRN
  Administered 2018-04-19: 1000 mg via INTRAVENOUS

## 2018-04-19 MED ORDER — METOPROLOL TARTRATE 25 MG PO TABS: 25.0000 mg | ORAL_TABLET | Freq: Two times a day (BID) | ORAL | Status: DC

## 2018-04-19 MED ORDER — CEFAZOLIN SODIUM-DEXTROSE 2-4 GM/100ML-% IV SOLN
2.0000 g | Freq: Three times a day (TID) | INTRAVENOUS | Status: AC
  Administered 2018-04-19 (×2): 2 g via INTRAVENOUS
  Filled 2018-04-19 (×2): qty 100

## 2018-04-19 MED ORDER — ROCURONIUM BROMIDE 100 MG/10ML IV SOLN
INTRAVENOUS | Status: DC | PRN
  Administered 2018-04-19: 50 mg via INTRAVENOUS

## 2018-04-19 MED ORDER — HYDROMORPHONE HCL 1 MG/ML IJ SOLN
0.5000 mg | INTRAMUSCULAR | Status: DC | PRN
  Administered 2018-04-24 – 2018-04-26 (×9): 0.5 mg via INTRAVENOUS
  Filled 2018-04-19 (×9): qty 1

## 2018-04-19 MED ORDER — HYDROCHLOROTHIAZIDE 25 MG PO TABS: 25.0000 mg | ORAL_TABLET | Freq: Every day | ORAL | Status: DC

## 2018-04-19 MED ORDER — LIDOCAINE-EPINEPHRINE 1 %-1:100000 IJ SOLN
INTRAMUSCULAR | Status: AC
  Filled 2018-04-19: qty 1

## 2018-04-19 MED ORDER — ROCURONIUM BROMIDE 50 MG/5ML IV SOSY
PREFILLED_SYRINGE | INTRAVENOUS | Status: AC
  Filled 2018-04-19: qty 5

## 2018-04-19 MED ORDER — SODIUM CHLORIDE 0.9% IV SOLUTION: Freq: Once | INTRAVENOUS | Status: DC

## 2018-04-19 MED ORDER — ROCURONIUM BROMIDE 50 MG/5ML IV SOSY
PREFILLED_SYRINGE | INTRAVENOUS | Status: AC
  Filled 2018-04-19: qty 10

## 2018-04-19 MED ORDER — ARTIFICIAL TEARS OPHTHALMIC OINT
TOPICAL_OINTMENT | OPHTHALMIC | Status: AC
  Filled 2018-04-19: qty 3.5

## 2018-04-19 MED ORDER — ONDANSETRON HCL 4 MG/2ML IJ SOLN
4.0000 mg | Freq: Once | INTRAMUSCULAR | Status: AC
  Administered 2018-04-19: 4 mg via INTRAVENOUS
  Filled 2018-04-19: qty 2

## 2018-04-19 MED ORDER — BACITRACIN ZINC 500 UNIT/GM EX OINT
TOPICAL_OINTMENT | CUTANEOUS | Status: DC | PRN
  Administered 2018-04-19: 1 via TOPICAL

## 2018-04-19 MED ORDER — SODIUM CHLORIDE 0.9 % IV SOLN
INTRAVENOUS | Status: DC | PRN
  Administered 2018-04-19: 20 ug/min via INTRAVENOUS

## 2018-04-19 MED ORDER — SODIUM CHLORIDE (PF) 0.9 % IJ SOLN
INTRAMUSCULAR | Status: AC
  Filled 2018-04-19: qty 10

## 2018-04-19 MED ORDER — SODIUM CHLORIDE 0.9 % IV SOLN
INTRAVENOUS | Status: DC
  Administered 2018-04-19 – 2018-04-26 (×12): via INTRAVENOUS

## 2018-04-19 MED ORDER — LIDOCAINE 2% (20 MG/ML) 5 ML SYRINGE
INTRAMUSCULAR | Status: AC
  Filled 2018-04-19: qty 5

## 2018-04-19 MED ORDER — DOCUSATE SODIUM 100 MG PO CAPS: 100.0000 mg | ORAL_CAPSULE | Freq: Two times a day (BID) | ORAL | Status: DC

## 2018-04-19 MED ORDER — SUCCINYLCHOLINE CHLORIDE 20 MG/ML IJ SOLN
INTRAMUSCULAR | Status: DC | PRN
  Administered 2018-04-19: 100 mg via INTRAVENOUS

## 2018-04-19 MED ORDER — PROPOFOL 1000 MG/100ML IV EMUL
5.0000 ug/kg/min | INTRAVENOUS | Status: DC
  Administered 2018-04-19: 5 ug/kg/min via INTRAVENOUS
  Filled 2018-04-19: qty 100

## 2018-04-19 MED ORDER — CHLORHEXIDINE GLUCONATE 0.12% ORAL RINSE (MEDLINE KIT)
15.0000 mL | Freq: Two times a day (BID) | OROMUCOSAL | Status: DC
  Administered 2018-04-19: 15 mL via OROMUCOSAL

## 2018-04-19 MED ORDER — THROMBIN 5000 UNITS EX SOLR
CUTANEOUS | Status: AC
  Filled 2018-04-19: qty 5000

## 2018-04-19 MED ORDER — THROMBIN 20000 UNITS EX SOLR
CUTANEOUS | Status: AC
  Filled 2018-04-19: qty 20000

## 2018-04-19 MED ORDER — ONDANSETRON HCL 4 MG PO TABS: 4.0000 mg | ORAL_TABLET | ORAL | Status: DC | PRN

## 2018-04-19 MED ORDER — ACETAMINOPHEN 650 MG RE SUPP: 650.0000 mg | RECTAL | Status: DC | PRN

## 2018-04-19 MED ORDER — FENTANYL CITRATE (PF) 250 MCG/5ML IJ SOLN
INTRAMUSCULAR | Status: DC | PRN
  Administered 2018-04-19 (×3): 50 ug via INTRAVENOUS
  Administered 2018-04-19: 100 ug via INTRAVENOUS

## 2018-04-19 MED ORDER — POLYETHYLENE GLYCOL 3350 17 G PO PACK: 17.0000 g | PACK | Freq: Every day | ORAL | Status: DC | PRN

## 2018-04-19 MED ORDER — HYDROCODONE-ACETAMINOPHEN 5-325 MG PO TABS
1.0000 | ORAL_TABLET | ORAL | Status: DC | PRN
  Administered 2018-04-25 – 2018-04-26 (×4): 1 via ORAL
  Filled 2018-04-19 (×4): qty 1

## 2018-04-19 MED ORDER — SUCCINYLCHOLINE CHLORIDE 200 MG/10ML IV SOSY
PREFILLED_SYRINGE | INTRAVENOUS | Status: AC
  Filled 2018-04-19: qty 10

## 2018-04-19 MED ORDER — VANCOMYCIN HCL IN DEXTROSE 1-5 GM/200ML-% IV SOLN
INTRAVENOUS | Status: AC
  Filled 2018-04-19: qty 200

## 2018-04-19 MED ORDER — MICROFIBRILLAR COLL HEMOSTAT EX PADS
MEDICATED_PAD | CUTANEOUS | Status: DC | PRN
  Administered 2018-04-19: 1 via TOPICAL

## 2018-04-19 MED ORDER — LABETALOL HCL 5 MG/ML IV SOLN
10.0000 mg | INTRAVENOUS | Status: DC | PRN
  Administered 2018-04-19 (×2): 20 mg via INTRAVENOUS
  Administered 2018-04-19: 10 mg via INTRAVENOUS
  Administered 2018-04-20 – 2018-04-24 (×6): 20 mg via INTRAVENOUS
  Filled 2018-04-19: qty 8
  Filled 2018-04-19 (×7): qty 4

## 2018-04-19 MED ORDER — LIDOCAINE-EPINEPHRINE 1 %-1:100000 IJ SOLN
INTRAMUSCULAR | Status: DC | PRN
  Administered 2018-04-19: 10 mL via INTRADERMAL

## 2018-04-19 MED ORDER — PROMETHAZINE HCL 25 MG PO TABS: 12.5000 mg | ORAL_TABLET | ORAL | Status: DC | PRN

## 2018-04-19 MED ORDER — SODIUM CHLORIDE 0.9 % IV SOLN
INTRAVENOUS | Status: DC | PRN
  Administered 2018-04-19: 500 mL

## 2018-04-19 SURGICAL SUPPLY — 68 items
BATTERY IQ STERILE (MISCELLANEOUS) IMPLANT
BLADE CLIPPER SURG (BLADE) ×3 IMPLANT
BNDG GAUZE ELAST 4 BULKY (GAUZE/BANDAGES/DRESSINGS) IMPLANT
BUR ACORN 9.0 PRECISION (BURR) ×2 IMPLANT
BUR ACORN 9.0MM PRECISION (BURR) ×1
BUR MATCHSTICK NEURO 3.0 LAGG (BURR) ×3 IMPLANT
BUR SPIRAL ROUTER 2.3 (BUR) ×2 IMPLANT
BUR SPIRAL ROUTER 2.3MM (BUR) ×1
CANISTER SUCT 3000ML PPV (MISCELLANEOUS) ×3 IMPLANT
CLIP VESOCCLUDE MED 6/CT (CLIP) IMPLANT
COVER WAND RF STERILE (DRAPES) ×3 IMPLANT
DRAIN JACKSON PRATT 10MM FLAT (MISCELLANEOUS) ×3 IMPLANT
DRAPE NEUROLOGICAL W/INCISE (DRAPES) ×3 IMPLANT
DRAPE SHEET LG 3/4 BI-LAMINATE (DRAPES) ×3 IMPLANT
DRAPE SURG 17X23 STRL (DRAPES) IMPLANT
DRAPE WARM FLUID 44X44 (DRAPE) ×3 IMPLANT
DURAPREP 6ML APPLICATOR 50/CS (WOUND CARE) ×3 IMPLANT
ELECT REM PT RETURN 9FT ADLT (ELECTROSURGICAL) ×3
ELECTRODE REM PT RTRN 9FT ADLT (ELECTROSURGICAL) ×1 IMPLANT
EVACUATOR 1/8 PVC DRAIN (DRAIN) IMPLANT
EVACUATOR SILICONE 100CC (DRAIN) ×3 IMPLANT
GAUZE 4X4 16PLY RFD (DISPOSABLE) IMPLANT
GAUZE SPONGE 4X4 12PLY STRL (GAUZE/BANDAGES/DRESSINGS) ×3 IMPLANT
GLOVE BIO SURGEON STRL SZ7.5 (GLOVE) ×6 IMPLANT
GLOVE BIOGEL M 7.0 STRL (GLOVE) ×3 IMPLANT
GLOVE BIOGEL PI IND STRL 7.5 (GLOVE) ×3 IMPLANT
GLOVE BIOGEL PI INDICATOR 7.5 (GLOVE) ×6
GLOVE EXAM NITRILE LRG STRL (GLOVE) IMPLANT
GLOVE EXAM NITRILE XL STR (GLOVE) IMPLANT
GLOVE EXAM NITRILE XS STR PU (GLOVE) IMPLANT
GOWN STRL REUS W/ TWL LRG LVL3 (GOWN DISPOSABLE) ×2 IMPLANT
GOWN STRL REUS W/ TWL XL LVL3 (GOWN DISPOSABLE) IMPLANT
GOWN STRL REUS W/TWL 2XL LVL3 (GOWN DISPOSABLE) IMPLANT
GOWN STRL REUS W/TWL LRG LVL3 (GOWN DISPOSABLE) ×4
GOWN STRL REUS W/TWL XL LVL3 (GOWN DISPOSABLE)
HEMOSTAT POWDER KIT SURGIFOAM (HEMOSTASIS) ×3 IMPLANT
HEMOSTAT SURGICEL 2X14 (HEMOSTASIS) ×3 IMPLANT
HOOK DURA 1/2IN (MISCELLANEOUS) IMPLANT
KIT BASIN OR (CUSTOM PROCEDURE TRAY) ×3 IMPLANT
KIT TURNOVER KIT B (KITS) ×3 IMPLANT
NEEDLE HYPO 22GX1.5 SAFETY (NEEDLE) ×3 IMPLANT
NS IRRIG 1000ML POUR BTL (IV SOLUTION) ×6 IMPLANT
PACK CRANIOTOMY CUSTOM (CUSTOM PROCEDURE TRAY) ×3 IMPLANT
PATTIES SURGICAL .5 X.5 (GAUZE/BANDAGES/DRESSINGS) IMPLANT
PATTIES SURGICAL .5 X3 (DISPOSABLE) IMPLANT
PATTIES SURGICAL 1X1 (DISPOSABLE) IMPLANT
PLATE 1.5/0.5 18.5MM BURR HOLE (Plate) ×9 IMPLANT
SCREW SELF DRILL HT 1.5/4MM (Screw) ×39 IMPLANT
SPONGE NEURO XRAY DETECT 1X3 (DISPOSABLE) IMPLANT
SPONGE SURGIFOAM ABS GEL 100 (HEMOSTASIS) ×3 IMPLANT
STAPLER VISISTAT 35W (STAPLE) ×3 IMPLANT
STOCKINETTE 6  STRL (DRAPES) ×2
STOCKINETTE 6 STRL (DRAPES) ×1 IMPLANT
SUT ETHILON 3 0 FSL (SUTURE) IMPLANT
SUT ETHILON 3 0 PS 1 (SUTURE) IMPLANT
SUT NURALON 4 0 TR CR/8 (SUTURE) ×6 IMPLANT
SUT STEEL 0 (SUTURE)
SUT STEEL 0 18XMFL TIE 17 (SUTURE) IMPLANT
SUT VIC AB 0 CT1 18XCR BRD8 (SUTURE) ×2 IMPLANT
SUT VIC AB 0 CT1 8-18 (SUTURE) ×4
SUT VIC AB 3-0 SH 8-18 (SUTURE) ×6 IMPLANT
TOWEL GREEN STERILE (TOWEL DISPOSABLE) ×3 IMPLANT
TOWEL GREEN STERILE FF (TOWEL DISPOSABLE) ×3 IMPLANT
TRAY FOLEY MTR SLVR 16FR STAT (SET/KITS/TRAYS/PACK) IMPLANT
TUBE CONNECTING 12'X1/4 (SUCTIONS) ×1
TUBE CONNECTING 12X1/4 (SUCTIONS) ×2 IMPLANT
UNDERPAD 30X30 (UNDERPADS AND DIAPERS) ×3 IMPLANT
WATER STERILE IRR 1000ML POUR (IV SOLUTION) ×3 IMPLANT

## 2018-04-19 NOTE — Anesthesia Preprocedure Evaluation (Addendum)
Anesthesia Evaluation  Patient identified by MRN, date of birth, ID band Patient confused    Reviewed: Allergy & Precautions, H&P , NPO status , Patient's Chart, lab work & pertinent test results, reviewed documented beta blocker date and time Preop documentation limited or incomplete due to emergent nature of procedure.  Airway Mallampati: II  TM Distance: >3 FB Neck ROM: Full    Dental no notable dental hx. (+) Teeth Intact, Dental Advisory Given   Pulmonary neg pulmonary ROS,    Pulmonary exam normal breath sounds clear to auscultation       Cardiovascular hypertension, Pt. on medications and Pt. on home beta blockers + dysrhythmias Atrial Fibrillation + Valvular Problems/Murmurs MR  Rhythm:Irregular Rate:Tachycardia     Neuro/Psych negative neurological ROS  negative psych ROS   GI/Hepatic negative GI ROS, Neg liver ROS,   Endo/Other  negative endocrine ROS  Renal/GU negative Renal ROS  negative genitourinary   Musculoskeletal  (+) Arthritis , Osteoarthritis,    Abdominal   Peds  Hematology negative hematology ROS (+)   Anesthesia Other Findings   Reproductive/Obstetrics negative OB ROS                           Anesthesia Physical Anesthesia Plan  ASA: III and emergent  Anesthesia Plan: General   Post-op Pain Management:    Induction: Intravenous, Rapid sequence and Cricoid pressure planned  PONV Risk Score and Plan: 4 or greater and Ondansetron, Dexamethasone and Treatment may vary due to age or medical condition  Airway Management Planned: Oral ETT  Additional Equipment: Arterial line  Intra-op Plan:   Post-operative Plan: Extubation in OR and Possible Post-op intubation/ventilation  Informed Consent: I have reviewed the patients History and Physical, chart, labs and discussed the procedure including the risks, benefits and alternatives for the proposed anesthesia with the  patient or authorized representative who has indicated his/her understanding and acceptance.   Dental advisory given  Plan Discussed with: CRNA  Anesthesia Plan Comments:        Anesthesia Quick Evaluation

## 2018-04-19 NOTE — Progress Notes (Signed)
Rehab Admissions Coordinator Note:  Patient was screened by Cleatrice Burke for appropriateness for an Inpatient Acute Rehab Consult per PT and OT recs. Once patient is more participatory with therapy, I would recommend an inpt rehab consult.Cleatrice Burke RN MSN 04/22/2018, 5:48 PM  I can be reached at [phone].

## 2018-04-19 NOTE — Transfer of Care (Signed)
Immediate Anesthesia Transfer of Care Note  Patient: [facility]  Procedure(s) Performed: CRANIOTOMY HEMATOMA EVACUATION SUBDURAL (Left Head)  Patient Location: ICU  Anesthesia Type:General  Level of Consciousness: sedated and Patient remains intubated per anesthesia plan  Airway & Oxygen Therapy: Patient remains intubated per anesthesia plan and Patient placed on Ventilator (see vital sign flow sheet for setting)  Post-op Assessment: Report given to RN and Post -op Vital signs reviewed and stable  Post vital signs: Reviewed and stable  Last Vitals:  Vitals Value Taken Time  BP    Temp    Pulse 97 05/13/2018  3:58 AM  Resp 22 04/25/2018  3:59 AM  SpO2 94 % 04/26/2018  3:58 AM  Vitals shown include unvalidated device data.  Last Pain:  Vitals:   04/25/2018 0110  TempSrc:   PainSc: Asleep         Complications: No apparent anesthesia complications

## 2018-04-19 NOTE — Progress Notes (Signed)
Patient transported from 4N24 to CT and back with no complications. 

## 2018-04-19 NOTE — ED Notes (Signed)
Transported to OR

## 2018-04-19 NOTE — Progress Notes (Signed)
   04/30/2018 0700  Clinical Encounter Type  Visited With Health care provider;Patient and family together;Family  Visit Type Initial;ED;Psychological support  Referral From Other (Comment) (ED secretary)  Spiritual Encounters  Spiritual Needs Emotional  Stress Factors  Patient Stress Factors Not reviewed  Family Stress Factors Major life changes;Loss of control   Responded to pg requesting chaplain to support daughter of pt.  Met daughter in trauma C.  Empathetic listening and presence.  Did not want other people to travel to hospital to support her at this time, but had several ppl available via telephone.  Stayed w/ daughter and pt from trauma C to PACU, walked daughter to 2surgery waiting area, let her know to ask hospital staff to pg chaplain if further support desired.  Daughter reports that her mother lives alone.  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  Myra Gianotti resident, [phone]

## 2018-04-19 NOTE — Anesthesia Procedure Notes (Signed)
Procedure Name: Intubation Date/Time: 05/05/2018 2:25 AM Performed by: Clovis Cao, CRNA Pre-anesthesia Checklist: Patient identified, Emergency Drugs available, Suction available, Patient being monitored and Timeout performed Patient Re-evaluated:Patient Re-evaluated prior to induction Oxygen Delivery Method: Circle system utilized Preoxygenation: Pre-oxygenation with 100% oxygen Induction Type: IV induction, Rapid sequence and Cricoid Pressure applied Laryngoscope Size: Miller and 2 Grade View: Grade I Tube type: Oral Tube size: 8.0 mm Number of attempts: 1 Airway Equipment and Method: Stylet Placement Confirmation: ETT inserted through vocal cords under direct vision,  positive ETCO2 and breath sounds checked- equal and bilateral Secured at: 22 cm Tube secured with: Tape Dental Injury: Teeth and Oropharynx as per pre-operative assessment

## 2018-04-19 NOTE — ED Notes (Signed)
Neurosurgeon explained plan of care to pt.'s daughter .

## 2018-04-19 NOTE — Progress Notes (Signed)
Spoke to Neurosurgery provider on call Reinaldo Meeker about patient being restless and removing all monitoring equipment, attempting to remove IVs, all with patient safety mitts on. New orders received for bilateral soft wrist restraints. Will continue to monitor.

## 2018-04-19 NOTE — Anesthesia Postprocedure Evaluation (Signed)
Anesthesia Post Note  Patient: [facility]  Procedure(s) Performed: CRANIOTOMY HEMATOMA EVACUATION SUBDURAL (Left Head)     Patient location during evaluation: SICU Anesthesia Type: General Level of consciousness: sedated Pain management: pain level controlled Vital Signs Assessment: post-procedure vital signs reviewed and stable Respiratory status: patient remains intubated per anesthesia plan Cardiovascular status: stable Postop Assessment: no apparent nausea or vomiting Anesthetic complications: no    Last Vitals:  Vitals:   05/13/2018 0746 05/18/2018 0814  BP:    Pulse: 76 90  Resp: 12 17  Temp:    SpO2:  98%    Last Pain:  Vitals:   05/04/2018 0405  TempSrc: Axillary  PainSc:                  [facility],W. EDMOND

## 2018-04-19 NOTE — Evaluation (Signed)
Physical Therapy Evaluation Patient Details Name: Cassandra Lang MRN: [id number] DOB: Apr 21, 1924 Today's Date: 05/19/2018   History of Present Illness  85 yo female s/p Crani L SDH intubated and extubated 05/04/2018 PMH: Afib arthritis, breast Ca 00 mastectomy and chem/radiation, HTn, PAC,   Clinical Impression  Pt admitted with above. Pt very lethargic today, not following commands or demonstrating active participation however pt recently extubated an hour ago and had surgery over night. Pt was indep and living alone, managing her home, finances, and driving PTA. Pt now currently requiring total assist for all mobility. Daughter reports she can either stay with her or patient can live with her if needed. At this time recommending CIR upon d/c for maximal functional recovery.    Follow Up Recommendations CIR    Equipment Recommendations  (TBD)    Recommendations for Other Services Rehab consult     Precautions / Restrictions Precautions Precautions: Fall Precaution Comments: oxygen Restrictions Weight Bearing Restrictions: No      Mobility  Bed Mobility Overal bed mobility: Needs Assistance Bed Mobility: Rolling;Supine to Sit;Sit to Supine Rolling: Total assist;+2 for physical assistance;+2 for safety/equipment   Supine to sit: Total assist;+2 for physical assistance;+2 for safety/equipment;HOB elevated Sit to supine: Total assist;+2 for physical assistance;+2 for safety/equipment;HOB elevated   General bed mobility comments: pt requires total (A) at EOB. pt with opening eyes with posterior LOB but no self correction. pt on 2nd and 3rd attempt no response  Transfers                 General transfer comment: defered to next session due to balance at EOB and arousal  Ambulation/Gait             General Gait Details: unable this date  Stairs            Wheelchair Mobility    Modified Rankin (Stroke Patients Only)       Balance Overall balance assessment:  Needs assistance Sitting-balance support: Bilateral upper extremity supported;Feet supported Sitting balance-Leahy Scale: Zero Sitting balance - Comments: posterior LOB and requires constant (A) . pt initially responding to facilitation for upright posture, pt with no protect response when posterior support decreased, pt with some active trunk extension with tactile cues to trunk Postural control: Posterior lean                                   Pertinent Vitals/Pain Pain Assessment: Faces Faces Pain Scale: Hurts even more Pain Location: generalized Pain Descriptors / Indicators: Grimacing;Moaning Pain Intervention(s): Monitored during session    Home Living Family/patient expects to be discharged to:: Private residence Living Arrangements: Alone Available Help at Discharge: Family;Available PRN/intermittently Type of Home: House Home Access: Stairs to enter   [employer] of Steps: 2 Home Layout: One level Home Equipment: Cane - single point Additional Comments: driving and management her own bills/ expenses    Prior Function Level of Independence: Independent with assistive device(s)               Hand Dominance   Dominant Hand: Right    Extremity/Trunk Assessment   Upper Extremity Assessment Upper Extremity Assessment: Defer to OT evaluation RUE Deficits / Details: decr sensation noted to move against gravity LUE Deficits / Details: responding to sensatin and pushing with L UE at EOB    Lower Extremity Assessment Lower Extremity Assessment: RLE deficits/detail RLE Deficits / Details: sensation to  painful stimulate with toe wiggle LLE Deficits / Details: does not respond to painful stimuli    Cervical / Trunk Assessment Cervical / Trunk Assessment: Kyphotic  Communication   Communication: Other (comment)(minimal sounds during session more moans)  Cognition Arousal/Alertness: Lethargic Behavior During Therapy: Flat affect Overall  Cognitive Status: Difficult to assess                                 General Comments: not following commands, decr arousal, pt does reach for L UE to remove therapist hand on patient      General Comments General comments (skin integrity, edema, etc.): crani incision on L side of skull with drainage noted; pt requires arousal for saturations 91% on oxygen [place]    Exercises     Assessment/Plan    PT Assessment Patient needs continued PT services  PT Problem List Decreased strength;Decreased range of motion;Decreased activity tolerance;Decreased balance;Decreased mobility;Decreased coordination;Decreased cognition;Decreased knowledge of use of DME;Decreased safety awareness       PT Treatment Interventions DME instruction;Gait training;Stair training;Functional mobility training;Therapeutic activities;Therapeutic exercise;Balance training;Neuromuscular re-education;Cognitive remediation;Patient/family education    PT Goals (Current goals can be found in the Care Plan section)  Acute Rehab PT Goals Patient Stated Goal: none stated PT Goal Formulation: With patient/family Time For Goal Achievement: 05/03/18 Potential to Achieve Goals: Good    Frequency Min 4X/week   Barriers to discharge        Co-evaluation PT/OT/SLP Co-Evaluation/Treatment: Yes Reason for Co-Treatment: Complexity of the patient's impairments (multi-system involvement) PT goals addressed during session: Mobility/safety with mobility OT goals addressed during session: ADL's and self-care;Proper use of Adaptive equipment and DME;Strengthening/ROM       AM-PAC PT "6 Clicks" Mobility  Outcome Measure Help needed turning from your back to your side while in a flat bed without using bedrails?: Total Help needed moving from lying on your back to sitting on the side of a flat bed without using bedrails?: Total Help needed moving to and from a bed to a chair (including a wheelchair)?: Total Help  needed standing up from a chair using your arms (e.g., wheelchair or bedside chair)?: Total Help needed to walk in hospital room?: Total Help needed climbing 3-5 steps with a railing? : Total 6 Click Score: 6    End of Session Equipment Utilized During Treatment: Oxygen Activity Tolerance: Patient limited by lethargy Patient left: in bed;with call bell/phone within reach;with bed alarm set;with family/visitor present(in chair position) Nurse Communication: Mobility status PT Visit Diagnosis: Unsteadiness on feet (R26.81);Difficulty in walking, not elsewhere classified (R26.2)    Time: 6144-3154 PT Time Calculation (min) (ACUTE ONLY): 19 min   Charges:   PT Evaluation $PT Eval Moderate Complexity: 1 Mod          Kittie Plater, PT, DPT Acute Rehabilitation Services Pager #: [phone] Office #: [phone]   Berline Lopes 05/17/2018, 3:10 PM

## 2018-04-19 NOTE — ED Notes (Signed)
Daughter signed consent form for pt.'s surgical ( craniotomy) procedure .

## 2018-04-19 NOTE — Brief Op Note (Signed)
05/07/2018  3:18 AM  PATIENT:  Cassandra Lang  85 y.o. female  PRE-OPERATIVE DIAGNOSIS:  Left acute subdural hematoma  POST-OPERATIVE DIAGNOSIS:  Left acute subdural hematoma  PROCEDURE:  Procedure(s): CRANIOTOMY HEMATOMA EVACUATION SUBDURAL (Left)  SURGEON:  Surgeon(s) and Role:    * Tanijah Morais, Joyice Faster, MD - Primary  PHYSICIAN ASSISTANT:   ANESTHESIA:   general  EBL:  150 mL   BLOOD ADMINISTERED:none  DRAINS: Subtemporal #7 flat drain w/ JP bulb   LOCAL MEDICATIONS USED:  LIDOCAINE   SPECIMEN:  No Specimen  DISPOSITION OF SPECIMEN:  N/A  COUNTS:  YES  TOURNIQUET:  * No tourniquets in log *  DICTATION: .Note written in EPIC  PLAN OF CARE: Admit to inpatient   PATIENT DISPOSITION:  PACU - hemodynamically stable.   Delay start of Pharmacological VTE agent (>24hrs) due to surgical blood loss or risk of bleeding: yes

## 2018-04-19 NOTE — Evaluation (Signed)
Occupational Therapy Evaluation Patient Details Name: Cassandra Lang MRN: [id number] DOB: 1924-11-05 Today's Date: 05/06/2018    History of Present Illness 85 yo female s/p Crani L SDH intubated and extubated 05/07/2018 PMH: Afib arthritis, breast Ca 00 mastectomy and chem/radiation, HTn, PAC,    Clinical Impression   Patient is s/p L SDH crani surgery resulting in functional limitations due to the deficits listed below (see OT problem list). Pt currently total +2 total for bed mobility and total (A) at EOB. Pt with minimal arousal with position change. No verbalizations. No response to daughter calling name. Patient will benefit from skilled OT acutely to increase independence and safety with ADLS to allow discharge CIR.     Follow Up Recommendations  CIR    Equipment Recommendations  3 in 1 bedside commode    Recommendations for Other Services Rehab consult     Precautions / Restrictions Precautions Precautions: Fall Precaution Comments: oxygen      Mobility Bed Mobility Overal bed mobility: Needs Assistance Bed Mobility: Rolling;Supine to Sit;Sit to Supine Rolling: Total assist;+2 for physical assistance;+2 for safety/equipment   Supine to sit: Total assist;+2 for physical assistance;+2 for safety/equipment;HOB elevated Sit to supine: Total assist;+2 for physical assistance;+2 for safety/equipment;HOB elevated   General bed mobility comments: pt requires total (A) at EOB. pt with opening eyes with posterior LOB but no self correction. pt on 2nd and 3rd attempt no response  Transfers                 General transfer comment: defered to next session due to balance at EOB and arousal    Balance Overall balance assessment: Needs assistance Sitting-balance support: Bilateral upper extremity supported;Feet supported Sitting balance-Leahy Scale: Zero Sitting balance - Comments: posterior LOB and requires constant (A) . pt initially responding to facilitation for upright  posture Postural control: Posterior lean                                 ADL either performed or assessed with clinical judgement   ADL Overall ADL's : Needs assistance/impaired     Grooming: Total assistance   Upper Body Bathing: Total assistance   Lower Body Bathing: Total assistance   Upper Body Dressing : Total assistance   Lower Body Dressing: Total assistance                 General ADL Comments: pt currently max (A)- total at EOB sitting so standing was not attempted. pt with decr arousal. pt with extubated at 13:09pm     Vision         Perception     Praxis      Pertinent Vitals/Pain Pain Assessment: Faces Faces Pain Scale: Hurts even more Pain Location: generalized Pain Descriptors / Indicators: Grimacing;Moaning Pain Intervention(s): Monitored during session;Repositioned     Hand Dominance Right   Extremity/Trunk Assessment Upper Extremity Assessment Upper Extremity Assessment: RUE deficits/detail;LUE deficits/detail RUE Deficits / Details: decr sensation noted to move against gravity LUE Deficits / Details: responding to sensatin and pushing with L UE at EOB   Lower Extremity Assessment Lower Extremity Assessment: RLE deficits/detail;LLE deficits/detail RLE Deficits / Details: sensation to painful stimulate with toe wiggle LLE Deficits / Details: does not respond to painful stimuli   Cervical / Trunk Assessment Cervical / Trunk Assessment: Kyphotic   Communication Communication Communication: Other (comment)(minimal sounds during session more moans)   Cognition Arousal/Alertness: Lethargic Behavior During  Therapy: Flat affect Overall Cognitive Status: Difficult to assess                                 General Comments: not following commands, decr arousal, pt does reach for L UE to remove therapist hand on patient   General Comments  crani incision on L side of skull with drainage noted; pt requires arousal  for saturations 91% on oxygen     Exercises     Shoulder Instructions      Home Living Family/patient expects to be discharged to:: Private residence Living Arrangements: Alone Available Help at Discharge: Family;Available PRN/intermittently Type of Home: House Home Access: Stairs to enter [employer] of Steps: 2   Home Layout: One level     Bathroom Shower/Tub: Teacher, early years/pre: Standard     Home Equipment: Cane - single point   Additional Comments: driving and management her own bills/ expenses      Prior Functioning/Environment Level of Independence: Independent with assistive device(s)                 OT Problem List: Decreased strength;Decreased activity tolerance;Decreased range of motion;Impaired balance (sitting and/or standing);Impaired vision/perception;Decreased coordination;Decreased cognition;Decreased safety awareness;Decreased knowledge of use of DME or AE;Decreased knowledge of precautions;Cardiopulmonary status limiting activity;Impaired sensation;Obesity;Pain      OT Treatment/Interventions: Self-care/ADL training;Therapeutic exercise;Neuromuscular education;Energy conservation;DME and/or AE instruction;Manual therapy;Modalities;Therapeutic activities;Cognitive remediation/compensation;Visual/perceptual remediation/compensation;Patient/family education;Balance training    OT Goals(Current goals can be found in the care plan section) Acute Rehab OT Goals Patient Stated Goal: none stated OT Goal Formulation: With family Time For Goal Achievement: 05/03/18 Potential to Achieve Goals: Good  OT Frequency: Min 3X/week   Barriers to D/C: Decreased caregiver support  lives alone and daughter is only chidl but will assist       Co-evaluation PT/OT/SLP Co-Evaluation/Treatment: Yes Reason for Co-Treatment: Complexity of the patient's impairments (multi-system involvement);Necessary to address cognition/behavior during  functional activity;To address functional/ADL transfers;For patient/therapist safety   OT goals addressed during session: ADL's and self-care;Proper use of Adaptive equipment and DME;Strengthening/ROM      AM-PAC OT "6 Clicks" Daily Activity     Outcome Measure Help from another person eating meals?: Total Help from another person taking care of personal grooming?: Total Help from another person toileting, which includes using toliet, bedpan, or urinal?: Total Help from another person bathing (including washing, rinsing, drying)?: Total Help from another person to put on and taking off regular upper body clothing?: Total Help from another person to put on and taking off regular lower body clothing?: Total 6 Click Score: 6   End of Session Equipment Utilized During Treatment: Oxygen Nurse Communication: Mobility status;Precautions;Need for lift equipment  Activity Tolerance: Patient limited by lethargy Patient left: in bed;with call bell/phone within reach;with bed alarm set;with family/visitor present;with SCD's reapplied(bed in chair position)  OT Visit Diagnosis: Unsteadiness on feet (R26.81);Muscle weakness (generalized) (M62.81);Repeated falls (R29.6)                Time: 3785-8850 OT Time Calculation (min): 20 min Charges:  OT General Charges $OT Visit: 1 Visit OT Evaluation $OT Eval Moderate Complexity: 1 Mod   Jeri Modena, OTR/L  Acute Rehabilitation Services Pager: [phone] Office: [phone] .   Jeri Modena 05/03/2018, 3:03 PM

## 2018-04-19 NOTE — Procedures (Signed)
Extubation Procedure Note  Patient Details:   Name: Cassandra Lang DOB: 1924-10-08 MRN: [id number]   Airway Documentation:    Vent end date: 05/12/2018 Vent end time: 1305   Evaluation  O2 sats: stable throughout Complications: No apparent complications Patient did tolerate procedure well. Bilateral Breath Sounds: Clear, Diminished   Yes  Patient was extubated to 2 LPM nasal cannula. Patient had a cuff leak. Strong cough but unable to say name at this time. No stridor noted. RN at bedside. BBS clear and diminished in the bases.   Huxley Shurley M 05/10/2018, 1:07 PM

## 2018-04-19 NOTE — Progress Notes (Signed)
Neurosurgery Service Progress Note  Subjective: No acute events overnight, no issues this morning   Objective: Vitals:   04/20/2018 0615 04/26/2018 0630 05/13/2018 0645 05/05/2018 0700  BP: (!) 104/53 (!) 121/57 (!) 104/45 (!) 96/52  Pulse: 74 83 81 86  Resp: 14 14 15 16   Temp:      TempSrc:      SpO2: 98% 99% 98% 98%  Weight:      Height:       Temp (24hrs), Avg:97.1 F (36.2 C), Min:96.7 F (35.9 C), Max:97.8 F (36.6 C)  CBC Latest Ref Rng & Units 05/02/2018 04/03/2018 03/14/2018  WBC 4.0 - 10.5 K/uL - 16.6(H) 6.1  Hemoglobin 12.0 - 15.0 g/dL 14.3 13.1 13.2  Hematocrit 36.0 - 46.0 % 42.0 38.8 38.3  Platelets 150 - 400 K/uL - 264 206   BMP Latest Ref Rng & Units 04/26/2018 04/16/2018 04/26/2012  Glucose 70 - 99 mg/dL 185(H) 190(H) 111(H)  BUN 8 - 23 mg/dL 18 15 26(H)  Creatinine 0.44 - 1.00 mg/dL 0.90 0.95 0.98  Sodium 135 - 145 mmol/L 126(L) 128(L) 139  Potassium 3.5 - 5.1 mmol/L 3.6 3.6 4.4  Chloride 98 - 111 mmol/L 89(L) 90(L) 99  CO2 22 - 32 mmol/L - 26 30  Calcium 8.9 - 10.3 mg/dL - 9.1 10.7(H)    Intake/Output Summary (Last 24 hours) at 05/05/2018 0742 Last data filed at 04/24/2018 0700 Gross per 24 hour  Intake 1130.5 ml  Output 680 ml  Net 450.5 ml    Current Facility-Administered Medications:  .  0.9 %  sodium chloride infusion (Manually program via Guardrails IV Fluids), , Intravenous, Once, Dayanis Bergquist, Joyice Faster, MD .  acetaminophen (TYLENOL) tablet 650 mg, 650 mg, Oral, Q4H PRN **OR** acetaminophen (TYLENOL) suppository 650 mg, 650 mg, Rectal, Q4H PRN, Ersie Savino A, MD .  ceFAZolin (ANCEF) IVPB 2g/100 mL premix, 2 g, Intravenous, Q8H, Judith Part, MD, Stopped at 04/21/2018 [phone] .  docusate sodium (COLACE) capsule 100 mg, 100 mg, Oral, BID, Jaylissa Felty A, MD .  fentaNYL (SUBLIMAZE) bolus via infusion 25 mcg, 25 mcg, Intravenous, Q1H PRN, Marya Lowden A, MD .  fentaNYL 2556mcg in NS 282mL (25mcg/ml) infusion-PREMIX, 25-400 mcg/hr, Intravenous,  Continuous, Brien Lowe A, MD, Last Rate: 5 mL/hr at 05/16/2018 0700, 50 mcg/hr at 05/15/2018 0700 .  hydrochlorothiazide (HYDRODIURIL) tablet 25 mg, 25 mg, Oral, Daily, Shaneil Yazdi A, MD .  HYDROcodone-acetaminophen (NORCO/VICODIN) 5-325 MG per tablet 1 tablet, 1 tablet, Oral, Q4H PRN, Judith Part, MD .  HYDROmorphone (DILAUDID) injection 0.5 mg, 0.5 mg, Intravenous, Q3H PRN, Judith Part, MD .  labetalol (NORMODYNE,TRANDATE) injection 10-40 mg, 10-40 mg, Intravenous, Q10 min PRN, Judith Part, MD, 20 mg at 05/03/2018 0636 .  metoprolol tartrate (LOPRESSOR) tablet 25 mg, 25 mg, Oral, BID, Matheson Vandehei A, MD .  ondansetron (ZOFRAN) tablet 4 mg, 4 mg, Oral, Q4H PRN **OR** ondansetron (ZOFRAN) injection 4 mg, 4 mg, Intravenous, Q4H PRN, Loucile Posner A, MD .  polyethylene glycol (MIRALAX / GLYCOLAX) packet 17 g, 17 g, Oral, Daily PRN, Judith Part, MD .  promethazine (PHENERGAN) tablet 12.5-25 mg, 12.5-25 mg, Oral, Q4H PRN, Judith Part, MD .  propofol (DIPRIVAN) 1000 MG/100ML infusion, 5-80 mcg/kg/min, Intravenous, Titrated, Delyla Sandeen, Joyice Faster, MD, Last Rate: 4.36 mL/hr at 05/12/2018 0700, 10 mcg/kg/min at 05/03/2018 0700   Physical Exam: Intubated, on sedation, pupils 87mm OU (on fentanyl gtt), nurses noted purposeful movements in all extremities when sedation held  Assessment &  Plan: 85 y.o. woman s/p emergent craniotomy for left acute subdural hematoma. Left intubated post-op for somnolence. 1/1 post-op CTH with great evacuation, mild pneumocephalus. Neuro: -post op CTH reassuring while exam suppressed on sedation, window exams with purposeful movements -continue drain until POD2  Cardiopulm: -spontaneous breathing trial this morning for possible extubation. If not able to be extubated this morning then will consult CCM for assistance with vent weaning -SBP<160  FENGI: -NPO for extubation, dobhoff for TF/Rx if not extubated -hyponatremic,  near baseline from admission, daily RFPs  Heme/ID: -hold therapeutic anticoagulation, start SQH on POD2  Dispo/PPx: -SCDs/TEDs -PT/OT if extubated  Judith Part  04/25/2018 7:42 AM

## 2018-04-19 NOTE — Progress Notes (Signed)
Neurosurgery Service Post-operative progress note  Assessment & Plan: 85 y.o. woman s/p crani for evac of L SDH. Unable to wean ETT due to somnolence in OR, will keep intubated overnight.  -post-op CTH ordered, obtain when able -propofol / fentanyl for sedation while on vent -spontaneous breathing trial in AM  Cassandra Lang  05/13/2018 3:39 AM

## 2018-04-19 NOTE — Anesthesia Procedure Notes (Signed)
Arterial Line Insertion Start/End01/10/2018 2:03 AM, 04/19/2018 2:07 AM Performed by: Roderic Palau, MD, anesthesiologist  Patient location: Pre-op. Preanesthetic checklist: patient identified, IV checked, site marked, risks and benefits discussed, surgical consent, monitors and equipment checked, pre-op evaluation, timeout performed and anesthesia consent Lidocaine 1% used for infiltration Right, radial was placed Catheter size: 20 Fr Hand hygiene performed  and maximum sterile barriers used   Attempts: 1 Procedure performed without using ultrasound guided technique. Following insertion, dressing applied and Biopatch. Post procedure assessment: normal and unchanged  Patient tolerated the procedure well with no immediate complications.

## 2018-04-19 NOTE — Op Note (Signed)
PATIENT: Cassandra Lang  DAY OF SURGERY: 04/30/2018   PRE-OPERATIVE DIAGNOSIS:  Left acute subdural hematoma   POST-OPERATIVE DIAGNOSIS:  Left acute subdural hematoma   PROCEDURE:  Left craniotomy for evacuation of subdural hematoma   SURGEON:  Surgeon(s) and Role:    Judith Part, MD - Primary      ANESTHESIA: ETGA   BRIEF HISTORY: This is a 85 year old woman who was found unresponsive after a likely fall. She was on eliquis for PAF and was found to have a very large left sided acute subdural hematoma. This was discussed with the patient as well as risks, benefits, and alternatives and wished to proceed with surgical evacuation. In discussing the situation with the patient's daughter, I did counsel her that, given the patient's advanced age, she has a significantly elevated risk of perioperative complications.    OPERATIVE DETAIL: The patient was taken to the operating room and placed on the OR table in the supine position. A formal time out was performed with two patient identifiers and confirmed the operative site. Anesthesia was induced by the anesthesia team. The operative site was marked, hair was clipped with surgical clippers, the area was then prepped and draped in a sterile fashion. An inverted question mark incision was placed on the left side of the head. A musculocutaneous flap was elevated and a trauma craniotomy flap was turned. Of note, there were very large scalp contusions present on dissection. The dura was opened with acute blood products under high pressure. These were evacuated and hemostasis was obtained. There were two clear areas of bleeding, an avulsed parietal vein and an avulsed inferior frontal vein. A drain was inserted and tunneled posteriorly through a burr hole, the dura was reapproximated, and the bone flap was replaced with titanium plates and screws. All instrument and sponge counts were correct, the incision was then closed in layers. The patient was then  returned to anesthesia for emergence. No apparent complications at the completion of the procedure.   EBL:  149mL   DRAINS: #7 flat drain in subgaleal space   SPECIMENS: none   Judith Part, MD 04/30/2018 1:44 AM

## 2018-04-19 NOTE — H&P (Signed)
Neurosurgery H&P  CC: Subdural hematoma  HPI: This is a 85 y.o. woman that presents after a suspected fall, found unresponsive by EMS with emesis and a scalp laceration. She is on anticoagulation for PAF which has already been reversed after [facility] showed a subdural hematoma. Due to her depressed mental status, no further history is available at this time besides chart review of her EMR.   ROS: A 14 point ROS was performed and is negative except as noted in the HPI but limited due to pt's depressed mental status.  PMHx:  Past Medical History:  Diagnosis Date  . A-fib ([place])   . Arthritis   . Breast cancer ([place])    a. 1999 s/p R partial mastectomy and chemo/radiation.  . Breast mass, left 03/21/2012   a. Benign on excisional biopsy 05/01/12   . Colitis   . Essential hypertension   . Hyperlipidemia   . Palpitations    a. 12/2014 Event Monitor: RSR, no arrhythmias.  . Symptomatic Premature atrial contractions    FamHx:  Family History  Problem Relation Age of Onset  . Kidney disease Father   . Breast cancer Sister    SocHx:  reports that she has never smoked. She has never used smokeless tobacco. She reports that she does not drink alcohol or use drugs.  Exam: Vital signs in last 24 hours: Temp:  [96.7 F (35.9 C)-96.9 F (36.1 C)] 96.9 F (36.1 C) (12/31 2305) Pulse Rate:  [72-87] 80 (01/01 0100) Resp:  [16-20] 18 (01/01 0100) BP: (129-175)/(54-138) 146/61 (01/01 0100) SpO2:  [91 %-99 %] 97 % (01/01 0100) Weight:  [72.6 kg] 72.6 kg (12/31 2321) General: Lying in hospital bed in trauma bay, appears acutely ill Head: normocephalic, +scalp lac HEENT: neck supple Pulmonary: breathing room air comfortably, no evidence of increased work of breathing Cardiac: irregularly irregular Abdomen: S NT ND Extremities: warm and well perfused x4 Neuro: Eyes minimally open to painful stimulus, not FC, +c/c/g, PERRL, gaze neutral, withdraws LUE and minimal movement of RUE, minimal w/d of BLE,  toes upgoing bilaterally   Assessment and Plan: 85 y.o. woman s/p suspected fall on eliquis for PAF s/p reversal w/ [place]. [facility] personally reviewed, which shows large left acute SDH measuring 29mm at its widest point with brain compression and 71mm of midline shift.  -discussed with patient's daughter at bedside. While she is 9, the patient still drives, lives alone and takes care of herself without assistance, sounds to be high functioning for her age. Her exam is globally depressed with some hemiparesis but she has intact brainstem reflexes and opens her eyes to stimulation. I warned her that, even with successful surgery, she is elderly and will be in the ICU and is at an elevated risk of perioperative complications. She stated that she believes that, if the patient were able to decide for herself, she would want to have surgery. I also warned her that she may not be extubated after surgery tonight, which is at the discretion of the anesthesia team. -OR for left craniotomy and emergent evacuation of hematoma -please call with any concerns or questions  Judith Part, MD 05/01/2018 1:09 AM [facility]

## 2018-04-19 DEATH — deceased

## 2018-04-20 ENCOUNTER — Encounter (HOSPITAL_COMMUNITY): Payer: Self-pay | Admitting: Neurological Surgery

## 2018-04-20 ENCOUNTER — Inpatient Hospital Stay (HOSPITAL_COMMUNITY): Payer: Medicare Other

## 2018-04-20 LAB — RENAL FUNCTION PANEL
Albumin: 3.2 g/dL — ABNORMAL LOW (ref 3.5–5.0)
Anion gap: 10 (ref 5–15)
BUN: 16 mg/dL (ref 8–23)
CO2: 27 mmol/L (ref 22–32)
Calcium: 8.6 mg/dL — ABNORMAL LOW (ref 8.9–10.3)
Chloride: 92 mmol/L — ABNORMAL LOW (ref 98–111)
Creatinine, Ser: 0.78 mg/dL (ref 0.44–1.00)
GFR calc Af Amer: >60 mL/min (ref >60)
GFR calc non Af Amer: >60 mL/min (ref >60)
GLUCOSE: 142 mg/dL — AB (ref 70–99)
Phosphorus: 3.1 mg/dL (ref 2.5–4.6)
Potassium: 4.3 mmol/L (ref 3.5–5.1)
SODIUM: 129 mmol/L — AB (ref 135–145)

## 2018-04-20 NOTE — Progress Notes (Signed)
Neurosurgery Service Progress Note  Subjective: No acute events overnight, extubated yesterday, had some restlessness overnight requiring soft restraints.   Objective: Vitals:   04/20/18 0400 04/20/18 0500 04/20/18 0600 04/20/18 0700  BP: (!) 144/89 (!) 159/76 (!) 126/53 (!) 152/79  Pulse: 84  81 91  Resp: 17 18 18 18   Temp: 97.6 F (36.4 C)     TempSrc: Axillary     SpO2: 100% 100% 100% 100%  Weight:      Height:       Temp (24hrs), Avg:97.7 F (36.5 C), Min:97.5 F (36.4 C), Max:97.9 F (36.6 C)  CBC Latest Ref Rng & Units 05/11/2018 03/30/2018 03/14/2018  WBC 4.0 - 10.5 K/uL - 16.6(H) 6.1  Hemoglobin 12.0 - 15.0 g/dL 14.3 13.1 13.2  Hematocrit 36.0 - 46.0 % 42.0 38.8 38.3  Platelets 150 - 400 K/uL - 264 206   BMP Latest Ref Rng & Units 04/20/2018 05/14/2018 03/24/2018  Glucose 70 - 99 mg/dL 142(H) 185(H) 190(H)  BUN 8 - 23 mg/dL 16 18 15   Creatinine 0.44 - 1.00 mg/dL 0.78 0.90 0.95  Sodium 135 - 145 mmol/L 129(L) 126(L) 128(L)  Potassium 3.5 - 5.1 mmol/L 4.3 3.6 3.6  Chloride 98 - 111 mmol/L 92(L) 89(L) 90(L)  CO2 22 - 32 mmol/L 27 - 26  Calcium 8.9 - 10.3 mg/dL 8.6(L) - 9.1    Intake/Output Summary (Last 24 hours) at 04/20/2018 0744 Last data filed at 04/20/2018 0700 Gross per 24 hour  Intake 1309.99 ml  Output 1095 ml  Net 214.99 ml    Current Facility-Administered Medications:  .  0.9 %  sodium chloride infusion (Manually program via Guardrails IV Fluids), , Intravenous, Once, Shadow Stiggers A, MD .  0.9 %  sodium chloride infusion, , Intravenous, Continuous, Bergman, Meghan D, NP, Last Rate: 75 mL/hr at 04/20/18 0700 .  acetaminophen (TYLENOL) tablet 650 mg, 650 mg, Oral, Q4H PRN **OR** acetaminophen (TYLENOL) suppository 650 mg, 650 mg, Rectal, Q4H PRN, Ceniyah Thorp, Joyice Faster, MD .  docusate sodium (COLACE) capsule 100 mg, 100 mg, Oral, BID, Aneshia Jacquet A, MD .  hydrochlorothiazide (HYDRODIURIL) tablet 25 mg, 25 mg, Oral, Daily, Nydia Ytuarte A, MD .   HYDROcodone-acetaminophen (NORCO/VICODIN) 5-325 MG per tablet 1 tablet, 1 tablet, Oral, Q4H PRN, Judith Part, MD .  HYDROmorphone (DILAUDID) injection 0.5 mg, 0.5 mg, Intravenous, Q3H PRN, Judith Part, MD .  labetalol (NORMODYNE,TRANDATE) injection 10-40 mg, 10-40 mg, Intravenous, Q10 min PRN, Judith Part, MD, 20 mg at 04/20/18 0108 .  MEDLINE mouth rinse, 15 mL, Mouth Rinse, BID, Zianne Schubring A, MD .  metoprolol tartrate (LOPRESSOR) tablet 25 mg, 25 mg, Oral, BID, Neville Pauls A, MD .  ondansetron (ZOFRAN) tablet 4 mg, 4 mg, Oral, Q4H PRN **OR** ondansetron (ZOFRAN) injection 4 mg, 4 mg, Intravenous, Q4H PRN, Django Nguyen A, MD .  polyethylene glycol (MIRALAX / GLYCOLAX) packet 17 g, 17 g, Oral, Daily PRN, Judith Part, MD .  promethazine (PHENERGAN) tablet 12.5-25 mg, 12.5-25 mg, Oral, Q4H PRN, Judith Part, MD   Physical Exam: Extubated, eyes open spontaneously, not following commands or answering questions, PERRL, gaze neutral, more responsive with LUE than RUE, will grip with L via visual cues but not on right, w/d on right  Assessment & Plan: 85 y.o. woman s/p emergent evacuation of SDH. Neuro: -exam concerning for left hemispheric dysfunction, CTH showed good evacuation, will place EEG leads -restraints as needed for pt safety, minimize delirium as able  Cardiopulm:  No active issues  FENGI: -not able to participate well enough for swallow eval, will f/u EEG results and follow through today. If unable to participate tomorrow morning, will place dobhoff for Rx and feeds.   Heme/ID: -holding home anticoagulation for PAF given SDH  Dispo/PPx: -SCDs/TEDs, hold SQH until tomorrow -PM&R consult once pt can participate better with therapies  Judith Part  04/20/18 7:44 AM

## 2018-04-20 NOTE — Progress Notes (Signed)
PT Cancellation Note  Patient Details Name: Cassandra Lang MRN: [id number] DOB: 03/24/1925   Cancelled Treatment:    Reason Eval/Treat Not Completed: Patient at procedure or test/unavailable.  Pt is on 24 hour EEG.  Normally, I would push to at least sit up EOB, but RN informed me that the EEG "glue" is no longer available at this time and the EEG tech has to use small pieces of tape to hold the leads in place.  We will not be able to sit up for risk of dislodging the leads.  PT will check back tomorrow PM.   Thanks,  Barbarann Ehlers. Emile Ringgenberg, PT, DPT  Acute Rehabilitation [phone] pager [phone]) [phone] office     Cassandra Lang 04/20/2018, 11:31 AM

## 2018-04-20 NOTE — Progress Notes (Signed)
LTM EEG started 

## 2018-04-21 ENCOUNTER — Inpatient Hospital Stay (HOSPITAL_COMMUNITY): Payer: Medicare Other

## 2018-04-21 DIAGNOSIS — G92 Toxic encephalopathy: Secondary | ICD-10-CM

## 2018-04-21 DIAGNOSIS — S065X9A Traumatic subdural hemorrhage with loss of consciousness of unspecified duration, initial encounter: Principal | ICD-10-CM

## 2018-04-21 LAB — RENAL FUNCTION PANEL
ANION GAP: 11 (ref 5–15)
Albumin: 3 g/dL — ABNORMAL LOW (ref 3.5–5.0)
BUN: 19 mg/dL (ref 8–23)
CO2: 22 mmol/L (ref 22–32)
Calcium: 8.6 mg/dL — ABNORMAL LOW (ref 8.9–10.3)
Chloride: 99 mmol/L (ref 98–111)
Creatinine, Ser: 0.74 mg/dL (ref 0.44–1.00)
GFR calc Af Amer: >60 mL/min (ref >60)
GFR calc non Af Amer: >60 mL/min (ref >60)
Glucose, Bld: 111 mg/dL — ABNORMAL HIGH (ref 70–99)
PHOSPHORUS: 2.4 mg/dL — AB (ref 2.5–4.6)
Potassium: 4.5 mmol/L (ref 3.5–5.1)
Sodium: 132 mmol/L — ABNORMAL LOW (ref 135–145)

## 2018-04-21 LAB — MAGNESIUM: Magnesium: 2 mg/dL (ref 1.7–2.4)

## 2018-04-21 LAB — GLUCOSE, CAPILLARY
Glucose-Capillary: 107 mg/dL — ABNORMAL HIGH (ref 70–99)
Glucose-Capillary: 93 mg/dL (ref 70–99)
Glucose-Capillary: 110 mg/dL — ABNORMAL HIGH (ref 70–99)
Glucose-Capillary: 105 mg/dL — ABNORMAL HIGH (ref 70–99)

## 2018-04-21 LAB — PHOSPHORUS: Phosphorus: 2.7 mg/dL (ref 2.5–4.6)

## 2018-04-21 MED ORDER — DOCUSATE SODIUM 50 MG/5ML PO LIQD
100.0000 mg | Freq: Two times a day (BID) | ORAL | Status: DC
  Administered 2018-04-21 – 2018-04-23 (×5): 100 mg
  Filled 2018-04-21 (×6): qty 10

## 2018-04-21 MED ORDER — SODIUM CHLORIDE 0.9 % IV SOLN
1250.0000 mg | INTRAVENOUS | Status: AC
  Administered 2018-04-21: 1250 mg via INTRAVENOUS
  Filled 2018-04-21: qty 12.5

## 2018-04-21 MED ORDER — LEVETIRACETAM IN NACL 500 MG/100ML IV SOLN
500.0000 mg | Freq: Two times a day (BID) | INTRAVENOUS | Status: DC
  Administered 2018-04-21 – 2018-04-26 (×11): 500 mg via INTRAVENOUS
  Filled 2018-04-21 (×12): qty 100

## 2018-04-21 MED ORDER — VITAL AF 1.2 CAL PO LIQD
1000.0000 mL | ORAL | Status: DC
  Administered 2018-04-21 – 2018-04-25 (×6): 1000 mL
  Filled 2018-04-21 (×4): qty 1000

## 2018-04-21 MED ORDER — HEPARIN SODIUM (PORCINE) 5000 UNIT/ML IJ SOLN
5000.0000 [IU] | Freq: Three times a day (TID) | INTRAMUSCULAR | Status: DC
  Administered 2018-04-21 – 2018-04-26 (×15): 5000 [IU] via SUBCUTANEOUS
  Filled 2018-04-21 (×15): qty 1

## 2018-04-21 MED ORDER — HYDROCHLOROTHIAZIDE 25 MG PO TABS
25.0000 mg | ORAL_TABLET | Freq: Every day | ORAL | Status: DC
  Administered 2018-04-21 – 2018-04-25 (×5): 25 mg
  Filled 2018-04-21 (×5): qty 1

## 2018-04-21 MED ORDER — METOPROLOL TARTRATE 25 MG PO TABS
25.0000 mg | ORAL_TABLET | Freq: Two times a day (BID) | ORAL | Status: DC
  Administered 2018-04-21 – 2018-04-25 (×10): 25 mg
  Filled 2018-04-21 (×10): qty 1

## 2018-04-21 NOTE — Progress Notes (Signed)
Notified Dr. Vertell Limber of patient's altered mental status. Patient does not open eyes to painful stimulus, intermittently moans, no purposeful speaking, withdraws on all extremities. Dr. Vertell Limber instructed to get a CT of Head and if no acute changes, contact neurology MD on call.

## 2018-04-21 NOTE — Procedures (Signed)
LTM-EEG Report  HISTORY: Continuous video-EEG monitoring performed for 84 year old with L SDH, encephalopathy. ACQUISITION: International 10-20 system for electrode placement; 18 channels with additional eyes linked to ipsilateral ears and EKG. Additional T1-T2 electrodes were used. Continuous video recording obtained.   EEG NUMBER:  MEDICATIONS:  Day 1:  See EMR  DAY #1: from 9407 04/20/18 to 0730 04/21/18    BACKGROUND: An overall medium voltage continuous recording with some spontaneous variability and reactivity. Waking background consisted of a medium voltage 7 Hz posterior dominant rhythm bilaterally with low voltage beta activity in the bilateral frontocentral regions and frequent theta activity diffusely. Sleep was captured with normal stage II sleep architecture. A breach effect was observed over the left hemisphere with increased voltage and fast activity. EPILEPTIFORM/PERIODIC ACTIVITY: There were runs of left anterior focal slowing with intermixed 1Hz  LPDs without clear ictal evolution. SEIZURES: no clear ictal evolution to LPDs EVENTS: none reported  EKG: no significant arrhythmia  SUMMARY: This was a moderately abnormal LTM EEG due to focal slowing with breach effect over the left anterior region. Periodic discharges were observed on that side, indicative of underlying cortical irritability. No discrete seizures were observed.

## 2018-04-21 NOTE — Progress Notes (Signed)
Initial Nutrition Assessment  DOCUMENTATION CODES:   Obesity unspecified  INTERVENTION:   Initiate tube feeding via Cortrak: - Vital AF 1.2 @ 50 ml/hr (1200 ml/day)  Tube feeding regimen provides 1440 kcal, 90 grams of protein, and 973 ml of H2O (100% of needs).  NUTRITION DIAGNOSIS:   Inadequate oral intake related to lethargy/confusion as evidenced by NPO status.  GOAL:   Patient will meet greater than or equal to 90% of their needs  MONITOR:   Diet advancement, Weight trends, TF tolerance, I & O's, Labs  REASON FOR ASSESSMENT:   Consult  Enteral/tube feeding initiation and management  ASSESSMENT:   85 year old female who presented to the ED on 12/31 with AMS and unresponsiveness. PMH significant for a-fib, HTN, HLD. Woke-up notable for left SDH with severe shift. S/p L craniotomy and emergent evacuation of SDH on 1/1.  Discussed pt with RN. Per SLP, pt not yet alert enough for swallow evaluation. Cortrak placed this morning, tip gastric per Cortrak team.  No family present at time of RD visit. Per discussion with RN, family reports pt lived alone and was independent PTA.  Medications reviewed and include: Colace, NS @ 75 ml/hr  Labs reviewed: sodium 132 (L), phosphorus 2.4 (L)  UOP: 300 ml x 24 hours JP drain: 104 ml x 24 hours I/O's: +1.5 L since admit  NUTRITION - FOCUSED PHYSICAL EXAM:    Most Recent Value  Orbital Region  No depletion  Upper Arm Region  No depletion  Thoracic and Lumbar Region  No depletion  Buccal Region  No depletion  Temple Region  No depletion  Clavicle Bone Region  Mild depletion  Clavicle and Acromion Bone Region  Mild depletion  Scapular Bone Region  Unable to assess  Dorsal Hand  No depletion  Patellar Region  No depletion  Anterior Thigh Region  No depletion  Posterior Calf Region  No depletion  Edema (RD Assessment)  None  Hair  Reviewed  Eyes  Unable to assess  Mouth  Reviewed  Skin  Reviewed  Nails  Reviewed        Diet Order:   Diet Order    None      EDUCATION NEEDS:   Not appropriate for education at this time  Skin:  Skin Assessment: Skin Integrity Issues: Incisions: head  Last BM:  PTA  Height:   Ht Readings from Last 1 Encounters:  04/29/2018 5\' 1"  (1.549 m)    Weight:   Wt Readings from Last 1 Encounters:  03/20/2018 72.6 kg    Ideal Body Weight:  47.7 kg  BMI:  Body mass index is 30.23 kg/m.  Estimated Nutritional Needs:   Kcal:  1400-1600  Protein:  75-90 grams  Fluid:  1.4-1.6 L    Gaynell Face, MS, RD, LDN Inpatient Clinical Dietitian Pager: [phone] Weekend/After Hours: [phone]

## 2018-04-21 NOTE — Progress Notes (Signed)
Neurosurgery Service Progress Note  Subjective: No acute events overnight  Objective: Vitals:   04/21/18 0500 04/21/18 0600 04/21/18 0700 04/21/18 0800  BP: 111/65 (!) 155/75 (!) 147/68 (!) 158/77  Pulse: 87 (!) 101 (!) 111 (!) 102  Resp: 19 (!) 23 (!) 24 19  Temp:      TempSrc:      SpO2: 100% 100% 100% 100%  Weight:      Height:       Temp (24hrs), Avg:97.9 F (36.6 C), Min:96.5 F (35.8 C), Max:98.5 F (36.9 C)  CBC Latest Ref Rng & Units 05/17/2018 03/26/2018 03/14/2018  WBC 4.0 - 10.5 K/uL - 16.6(H) 6.1  Hemoglobin 12.0 - 15.0 g/dL 14.3 13.1 13.2  Hematocrit 36.0 - 46.0 % 42.0 38.8 38.3  Platelets 150 - 400 K/uL - 264 206   BMP Latest Ref Rng & Units 04/21/2018 04/20/2018 05/08/2018  Glucose 70 - 99 mg/dL 111(H) 142(H) 185(H)  BUN 8 - 23 mg/dL 19 16 18   Creatinine 0.44 - 1.00 mg/dL 0.74 0.78 0.90  Sodium 135 - 145 mmol/L 132(L) 129(L) 126(L)  Potassium 3.5 - 5.1 mmol/L 4.5 4.3 3.6  Chloride 98 - 111 mmol/L 99 92(L) 89(L)  CO2 22 - 32 mmol/L 22 27 -  Calcium 8.9 - 10.3 mg/dL 8.6(L) 8.6(L) -    Intake/Output Summary (Last 24 hours) at 04/21/2018 0834 Last data filed at 04/21/2018 0800 Gross per 24 hour  Intake 1480.62 ml  Output 804 ml  Net 676.62 ml    Current Facility-Administered Medications:  .  0.9 %  sodium chloride infusion (Manually program via Guardrails IV Fluids), , Intravenous, Once, Mizael Sagar A, MD .  0.9 %  sodium chloride infusion, , Intravenous, Continuous, Bergman, Meghan D, NP, Last Rate: 75 mL/hr at 04/21/18 0717 .  acetaminophen (TYLENOL) tablet 650 mg, 650 mg, Oral, Q4H PRN **OR** acetaminophen (TYLENOL) suppository 650 mg, 650 mg, Rectal, Q4H PRN, Nashea Chumney, Joyice Faster, MD .  docusate sodium (COLACE) capsule 100 mg, 100 mg, Oral, BID, Trapper Meech A, MD .  hydrochlorothiazide (HYDRODIURIL) tablet 25 mg, 25 mg, Oral, Daily, Alrick Cubbage A, MD .  HYDROcodone-acetaminophen (NORCO/VICODIN) 5-325 MG per tablet 1 tablet, 1 tablet, Oral, Q4H  PRN, Judith Part, MD .  HYDROmorphone (DILAUDID) injection 0.5 mg, 0.5 mg, Intravenous, Q3H PRN, Judith Part, MD .  labetalol (NORMODYNE,TRANDATE) injection 10-40 mg, 10-40 mg, Intravenous, Q10 min PRN, Judith Part, MD, 20 mg at 04/21/18 0419 .  MEDLINE mouth rinse, 15 mL, Mouth Rinse, BID, Judith Part, MD, 15 mL at 04/20/18 2122 .  metoprolol tartrate (LOPRESSOR) tablet 25 mg, 25 mg, Oral, BID, Eldor Conaway A, MD .  ondansetron (ZOFRAN) tablet 4 mg, 4 mg, Oral, Q4H PRN **OR** ondansetron (ZOFRAN) injection 4 mg, 4 mg, Intravenous, Q4H PRN, Anais Koenen A, MD .  polyethylene glycol (MIRALAX / GLYCOLAX) packet 17 g, 17 g, Oral, Daily PRN, Judith Part, MD .  promethazine (PHENERGAN) tablet 12.5-25 mg, 12.5-25 mg, Oral, Q4H PRN, Judith Part, MD   Physical Exam: Eyes open to stimulation, not FC, PERRL, gaze neutral, w/d x4 and moans to painful stimulus  Assessment & Plan: 85 y.o. woman s/p emergent evacuation of SDH. Neuro: -f/u EEG read, if negative then will d/c EEG and obtain new CTH -restraints as needed for pt safety, minimize delirium as able  Cardiopulm: No active issues  FENGI: -place dobhoff -requiring straight caths repeatedly with significant discomfort and low UOP, replace foley catheter  Heme/ID: -  holding home anticoagulation for PAF given SDH  Dispo/PPx: -SCDs/TEDs, start SQH -PM&R consult once pt can participate better with therapies  Judith Part  04/21/18 8:34 AM

## 2018-04-21 NOTE — Procedures (Signed)
Cortrak  Person Inserting Tube:  Keyon Liller J, RD Tube Type:  Cortrak - 43 inches Tube Location:  Right nare Initial Placement:  Stomach Secured by: Bridle Technique Used to Measure Tube Placement:  Documented cm marking at nare/ corner of mouth Cortrak Secured At:  66 cm    Cortrak Tube Team Note:  Consult received to place a Cortrak feeding tube.   No x-ray is required. RN may begin using tube.   If the tube becomes dislodged please keep the tube and contact the Cortrak team at [email or web address] ([email or web address]) for replacement.  If after hours and replacement cannot be delayed, place a NG tube and confirm placement with an abdominal x-ray.    Kate Jablonski Denzil Mceachron, MS, RD, LDN Inpatient Clinical Dietitian Pager: [phone] Weekend/After Hours: [phone]   

## 2018-04-21 NOTE — Progress Notes (Signed)
DC LTM per Dr Zada Finders per RN

## 2018-04-21 NOTE — Progress Notes (Signed)
Physical Therapy Treatment Patient Details Name: Cassandra Lang MRN: [id number] DOB: 1924-04-26 Today's Date: 04/21/2018    History of Present Illness 85 yo female s/p crani due to L SDH (presumed fall, found down by EMS) intubated and extubated 04/26/2018. PMH: Afib arthritis, breast CA '00 mastectomy and chem/radiation, HTN, and PAC,     PT Comments    Pt was able to tolerate sitting EOB for nearly 20 mins today with min guard to mod assist support at trunk.  I was unable to get much arousal out of her as she kept her eyes closed the entire time we did mobility today.  VSS on 5 L O2 [place].  Daughter present to observe session and attempt to get pt to respond with familiar voice.  Pt presents today like a [place] 2.  She has what is somewhere between guarding and tone in bil LEs, but did seem to wiggle her feet spontaneously when sitting EOB.  PROM preformed in all 4 extremities.  I am hopeful for increased arousal next session. PT will continue to follow acutely for safe mobility progression  Follow Up Recommendations  CIR     Equipment Recommendations  Wheelchair cushion (measurements PT);Wheelchair (measurements PT);Hospital bed;Other (comment)(hoyer lift)    Recommendations for Other Services   NA     Precautions / Restrictions Precautions Precautions: Fall Precaution Comments: per daughter h/o falls and was "supposed to" use a cane, but often did not or forgot.    Mobility  Bed Mobility Overal bed mobility: Needs Assistance Bed Mobility: Rolling;Sidelying to Sit;Sit to Supine Rolling: +2 for physical assistance;Total assist Sidelying to sit: +2 for physical assistance;Total assist;HOB elevated   Sit to supine: +2 for physical assistance;Total assist   General bed mobility comments: Total assist to transition to EOB with two person assist.  Pt not making any inititiation to help with any of these transitions.   Transfers                 General transfer comment: NT due to  decreased arousal         Balance Overall balance assessment: Needs assistance Sitting-balance support: Feet supported;Bilateral upper extremity supported Sitting balance-Leahy Scale: Poor Sitting balance - Comments: Pt can get up to min guard assist with both feet on the floor and early in sitting before fatigue, she can get up to heavy mod assist EOB.                                      Cognition Arousal/Alertness: Lethargic(eyes closed entire session) Behavior During Therapy: Flat affect Overall Cognitive Status: Impaired/Different from baseline Area of Impairment: Orientation;Attention;Memory;Following commands;Awareness;Safety/judgement;Problem solving;Rancho level               [place]ancho Levels of Cognitive Functioning [place] [employer] Scales of Cognitive Functioning: Generalized response Orientation Level: Disoriented to;Person;Place;Time;Situation Current Attention Level: (none)   Following Commands: (none followed)   Awareness: (none) Problem Solving: Slow processing;Decreased initiation;Difficulty sequencing General Comments: Grumbles and groans at times, did not follow any commands for me or arouse more EOB vs supine in bed.       Exercises General Exercises - Upper Extremity Shoulder Flexion: PROM;Both;10 reps Elbow Flexion: PROM;Both;10 reps General Exercises - Lower Extremity Ankle Circles/Pumps: PROM;Both;10 reps Heel Slides: PROM;Both;10 reps Hip ABduction/ADduction: PROM;Both;10 reps        Pertinent Vitals/Pain Pain Assessment: Faces Faces Pain Scale: No hurt  PT Goals (current goals can now be found in the care plan section) Acute Rehab PT Goals Patient Stated Goal: none stated by pt, daughter presents and wants her to "perk up" "smile again" Progress towards PT goals: Not progressing toward goals - comment(due to level of arousal)    Frequency    Min 3X/week      PT Plan Frequency needs to be updated        AM-PAC PT "6 Clicks" Mobility   Outcome Measure  Help needed turning from your back to your side while in a flat bed without using bedrails?: Total Help needed moving from lying on your back to sitting on the side of a flat bed without using bedrails?: Total Help needed moving to and from a bed to a chair (including a wheelchair)?: Total Help needed standing up from a chair using your arms (e.g., wheelchair or bedside chair)?: Total Help needed to walk in hospital room?: Total Help needed climbing 3-5 steps with a railing? : Total 6 Click Score: 6    End of Session Equipment Utilized During Treatment: Oxygen(5L) Activity Tolerance: Patient limited by lethargy Patient left: in bed;with call bell/phone within reach;with nursing/sitter in room;with family/visitor present Nurse Communication: Other (comment)(RN assisting in mobility and going to preform bath at end) PT Visit Diagnosis: Unsteadiness on feet (R26.81);Difficulty in walking, not elsewhere classified (R26.2)     Time: 0177-9390 PT Time Calculation (min) (ACUTE ONLY): 31 min  Charges:  $Therapeutic Activity: 23-37 mins            Issachar Broady B. Miasia Crabtree, PT, DPT  Acute Rehabilitation [phone] pager #[phone] office            04/21/2018, 4:43 PM

## 2018-04-21 NOTE — Progress Notes (Signed)
SLP Cancellation Note  Patient Details Name: Cassandra Lang MRN: [id number] DOB: 09-13-1924   Cancelled treatment:       Reason Eval/Treat Not Completed: Patient not medically ready. Arousal not sufficient for SLP interventions.    Aicia Babinski, Katherene Ponto 04/21/2018, 10:06 AM

## 2018-04-21 NOTE — Consult Note (Addendum)
NEURO HOSPITALIST CONSULT NOTE   Requestig physician: Dr. Wayland Salinas  Reason for Consult: AMS; not waking up  History obtained from:  Chart  HPI:                                                                                                                                          Cassandra Lang is an 85 y.o. female  With PMH HLD, HTN, Breast cancer, A. Fib ( on eliquis) presented to [facility] ED via EMS after suspected fall. Neurology consulted for  AED management.   Patient  Was found unresponsive by EMS with emesis and a scalp laceration. Patient on eliquis for a. Fib. eliquis reversed after CTH showed subdural hematoma.  Hospital course: 05/03/2018: Left craniotomy and emergent evacuation of hematoma 04/21/18: LTM EEG: showed LPD's  Past Medical History:  Diagnosis Date  . A-fib ([place])   . Arthritis   . Breast cancer ([place])    a. 1999 s/p R partial mastectomy and chemo/radiation.  . Breast mass, left 03/21/2012   a. Benign on excisional biopsy 05/01/12   . Colitis   . Essential hypertension   . Hyperlipidemia   . Palpitations    a. 12/2014 Event Monitor: RSR, no arrhythmias.  . Symptomatic Premature atrial contractions     Past Surgical History:  Procedure Laterality Date  . ABDOMINAL HYSTERECTOMY    . APPENDECTOMY    . BREAST BIOPSY  05/01/2012   Procedure: BREAST BIOPSY WITH NEEDLE LOCALIZATION;  Surgeon: Haywood Lasso, MD;  Location: [place];  Service: General;  Laterality: Left;  needle localization left breast biopsy and left nipple biopsy  . BREAST EXCISIONAL BIOPSY Left 05/01/2012   benign  . BREAST EXCISIONAL BIOPSY Bilateral 03/08/1998   malignant  . BREAST LUMPECTOMY Right 03/08/1998  . CARDIAC CATHETERIZATION     rt eye-cataract  . COLONOSCOPY    . CRANIOTOMY Left 05/01/2018   Procedure: CRANIOTOMY HEMATOMA EVACUATION SUBDURAL;  Surgeon: Judith Part, MD;  Location: [place];  Service: Neurosurgery;  Laterality: Left;  Marland Kitchen  MASTECTOMY PARTIAL / LUMPECTOMY  1999   rt lump-rt axillary dissection-#20  . TONSILLECTOMY    . TUMOR REMOVAL     FROM FALLOPIAN TUBE   Family History  Problem Relation Age of Onset  . Kidney disease Father   . Breast cancer Sister          Social History:  reports that she has never smoked. She has never used smokeless tobacco. She reports that she does not drink alcohol or use drugs.  Allergies  Allergen Reactions  . Penicillins Rash    DID THE REACTION INVOLVE: Swelling of the face/tongue/throat, SOB, or low BP? Unknown Sudden or severe rash/hives, skin peeling, or the inside of the mouth  or nose? Unknown Did it require medical treatment? Unknown When did it last happen?unk If all above answers are "NO", may proceed with cephalosporin use.     MEDICATIONS:                                                                                                                     Scheduled: . sodium chloride   Intravenous Once  . docusate  100 mg Per Tube BID  . heparin injection (subcutaneous)  5,000 Units Subcutaneous Q8H  . hydrochlorothiazide  25 mg Per Tube Daily  . mouth rinse  15 mL Mouth Rinse BID  . metoprolol tartrate  25 mg Per Tube BID   Continuous: . sodium chloride 75 mL/hr at 04/21/18 1100  . feeding supplement (VITAL AF 1.2 CAL)     MVH:[id number] **OR** acetaminophen, HYDROcodone-acetaminophen, HYDROmorphone (DILAUDID) injection, labetalol, ondansetron **OR** ondansetron (ZOFRAN) IV, polyethylene glycol, promethazine   ROS:                                                                                                                                       History  unobtainable from patient due to mental status  Blood pressure 123/60, pulse 90, temperature 97.9 F (36.6 C), temperature source Axillary, resp. rate 19, height 5\' 1"  (1.549 m), weight 72.6 kg, SpO2 100 %.  General Examination:                                                                                                       Physical Exam  HEENT-  Normocephalic, no lesions, without obvious abnormality.  Normal external eye and conjunctiva.   Cardiovascular- S1-S2 audible, pulses palpable throughout   Lungs-no rhonchi or wheezing noted, no excessive working breathing.  Saturations within normal limits on 2 L [place] Abdomen- All 4 quadrants palpated and nontender Extremities- Warm, dry and intact Musculoskeletal-no joint tenderness, deformity or swelling Skin-warm and dry, no hyperpigmentation, vitiligo, or suspicious lesions  Neurological Examination Mental Status: Patient in bed,  NGT in place. Bilateral mitt restraints. Does not awake to voice. Opens eyes to sternal rub Cranial Nerves: II: blinks to threat from left only III,IV, VI: PERRL  Motor/Sensory: Withdraws BLE to noxious.right leg with  Decreased withdrawal when compared to left. BUE attempts made to withdraw to noxious. Left side withdrew more than right. Tone and bulk:normal tone throughout; no atrophy noted; stiff, rigidity noted.  Gait: deferred   Lab Results: Basic Metabolic Panel: Recent Labs  Lab 03/28/2018 2337 05/02/2018 0000 04/20/18 0329 04/21/18 0344  NA 128* 126* 129* 132*  K 3.6 3.6 4.3 4.5  CL 90* 89* 92* 99  CO2 26  --  27 22  GLUCOSE 190* 185* 142* 111*  BUN 15 18 16 19   CREATININE 0.95 0.90 0.78 0.74  CALCIUM 9.1  --  8.6* 8.6*  PHOS  --   --  3.1 2.4*    CBC: Recent Labs  Lab 03/20/2018 2337 04/26/2018 0000  WBC 16.6*  --   NEUTROABS 15.0*  --   HGB 13.1 14.3  HCT 38.8 42.0  MCV 88.0  --   PLT 264  --     Lipid Panel: Recent Labs  Lab 05/14/2018 0616  TRIG 68   1-3/20: LTM EEG: There were runs of left anterior focal slowing with intermixed 1Hz  LPDs without clear ictal evolution.; This was a moderately abnormal LTM EEG due to focal slowing with breach effect over the left anterior region. Periodic discharges were observed on that side, indicative of underlying cortical  irritability. No discrete seizures were observed.  Imaging: [facility] 04/22/2018  1. Large, acute left subdural hematoma measuring up to 2.8 cm in thickness with 14 mm rightward midline shift. 2. Large left parietal scalp laceration and hematoma without skull fracture. 3. No acute fracture or static subluxation of the cervical spine, allowing for moderate motion degradation.  Repeat CTH: 05/16/2018 Markedly decreased size of left convexity subdural hematoma following craniotomy and evacuation. Multifocal small volume subarachnoid blood and approximately 3 mm of residual rightward midline shift.  Laurey Morale, MSN, NP-C [facility] [phone]  Attending neurologist's note to follow  04/21/2018, 11:33 AM  Attending addendum Patient seen and examined 85 year old with a past medical history of hypertension hyperlipidemia breast cancer atrial fibrillation on Eliquis was brought to the emergency room after a fall.  CT scan revealed a large subdural hematoma on the left convexity measuring 28 mm at widest with 13 mm of midline shift and significant amount of brain compression.  She was taken in for an emergent evacuation after K-centra reversal of Eliquis.  She remains very somnolent.  Long-term EEG was obtained and showed lateralized periodic discharges over the left cerebral hemisphere, for which neurological consultation was obtained. Patient unable to provide any history at this time. Pt unable to provide ROS, Family history or social history due to her AMS.  On examination, the patient laying comfortably in bed, breathing on supplemental oxygen, in no apparent distress. She is not on any sedation.  She has a wrist in restraints, which was removed for the duration of the exam. She does not open eyes to voice but opens eyes to sternal rub. Her pupils are equal round reactive to light.  She only blinks to threat from the left and does not blink to threat from the right.   Oculocephalic reflex present. Withdraws strongly on the left and weakly on the right. Cannot test for gait and coordination at this time.  I have reviewed the imaging independently.  CT scan on arrival showed a left convexity subdural hematoma measuring 28 mm at its widest point with a 30 mm midline shift and post craniotomy and evacuation scan showed minimal 3 mm midline shift.  EEG with LPD's  Labs - mild hyponatremia, mild hyperglycemia.  Assessment: 85 year old woman past medical history of hypertension hyperlipidemia breast cancer A. fib on Eliquis presented after a fall and was found to have a large left-sided subdural hematoma.  Taken in for emergent surgery for hematoma evacuation. Continues to be somnolent.  EEG shows LPD's without clear evolution to seizures but given the acute subdural-I would recommend antiepileptics at this time.  I would follow her clinically and with spot EEGs  IMPRESSION SDH s/p evacuation Possible epileptiform activity Toxic metabolic encephalopathy  Recommendations: --1250 Keppra load now; followed by 500 mg Keppra BID --seizure precautions --Routine EEG tomorrow morning --Repeat CTH per NSGY --neurology will continue to follow with you  -- Amie Portland, MD [facility] Pager: [phone] If 7pm to 7am, please call on call as listed on AMION.  CRITICAL CARE ATTESTATION Performed by: Amie Portland, MD Total critical care time: 35 minutes Critical care time was exclusive of separately billable procedures and treating other patients and/or supervising APPs/Residents/Students Critical care was necessary to treat or prevent imminent or life-threatening deterioration due to SDH, altered mental status. This patient is critically ill and at significant risk for neurological worsening and/or death and care requires constant monitoring. Critical care was time spent personally by me on the following activities: development of treatment plan with  patient and/or surrogate as well as nursing, discussions with consultants, evaluation of patient's response to treatment, examination of patient, obtaining history from patient or surrogate, ordering and performing treatments and interventions, ordering and review of laboratory studies, ordering and review of radiographic studies, pulse oximetry, re-evaluation of patient's condition, participation in multidisciplinary rounds and medical decision making of high complexity in the care of this patient.

## 2018-04-22 ENCOUNTER — Inpatient Hospital Stay (HOSPITAL_COMMUNITY): Payer: Medicare Other

## 2018-04-22 DIAGNOSIS — R569 Unspecified convulsions: Secondary | ICD-10-CM

## 2018-04-22 DIAGNOSIS — R401 Stupor: Secondary | ICD-10-CM

## 2018-04-22 LAB — RENAL FUNCTION PANEL
ALBUMIN: 2.7 g/dL — AB (ref 3.5–5.0)
Anion gap: 9 (ref 5–15)
BUN: 21 mg/dL (ref 8–23)
CO2: 25 mmol/L (ref 22–32)
Calcium: 8.5 mg/dL — ABNORMAL LOW (ref 8.9–10.3)
Chloride: 99 mmol/L (ref 98–111)
Creatinine, Ser: 0.64 mg/dL (ref 0.44–1.00)
GFR calc Af Amer: >60 mL/min (ref >60)
GFR calc non Af Amer: >60 mL/min (ref >60)
Glucose, Bld: 120 mg/dL — ABNORMAL HIGH (ref 70–99)
Phosphorus: 1.9 mg/dL — ABNORMAL LOW (ref 2.5–4.6)
Potassium: 3.7 mmol/L (ref 3.5–5.1)
Sodium: 133 mmol/L — ABNORMAL LOW (ref 135–145)

## 2018-04-22 LAB — GLUCOSE, CAPILLARY
GLUCOSE-CAPILLARY: 117 mg/dL — AB (ref 70–99)
GLUCOSE-CAPILLARY: 115 mg/dL — AB (ref 70–99)
Glucose-Capillary: 104 mg/dL — ABNORMAL HIGH (ref 70–99)
Glucose-Capillary: 105 mg/dL — ABNORMAL HIGH (ref 70–99)
Glucose-Capillary: 96 mg/dL (ref 70–99)
Glucose-Capillary: 99 mg/dL (ref 70–99)

## 2018-04-22 LAB — CBC
HCT: 33.3 % — ABNORMAL LOW (ref 36.0–46.0)
Hemoglobin: 10.9 g/dL — ABNORMAL LOW (ref 12.0–15.0)
MCH: 29.9 pg (ref 26.0–34.0)
MCHC: 32.7 g/dL (ref 30.0–36.0)
MCV: 91.5 fL (ref 80.0–100.0)
Platelets: 253 10*3/uL (ref 150–400)
RBC: 3.64 MIL/uL — ABNORMAL LOW (ref 3.87–5.11)
RDW: 13.2 % (ref 11.5–15.5)
WBC: 13.4 10*3/uL — ABNORMAL HIGH (ref 4.0–10.5)
nRBC: 0.1 % (ref 0.0–0.2)

## 2018-04-22 LAB — PHOSPHORUS
Phosphorus: 1.7 mg/dL — ABNORMAL LOW (ref 2.5–4.6)
Phosphorus: 1.9 mg/dL — ABNORMAL LOW (ref 2.5–4.6)

## 2018-04-22 LAB — MAGNESIUM
Magnesium: 2 mg/dL (ref 1.7–2.4)
Magnesium: 1.9 mg/dL (ref 1.7–2.4)

## 2018-04-22 MED ORDER — BETHANECHOL CHLORIDE 10 MG PO TABS
10.0000 mg | ORAL_TABLET | Freq: Four times a day (QID) | ORAL | Status: DC
  Administered 2018-04-22 – 2018-04-25 (×15): 10 mg
  Filled 2018-04-22 (×14): qty 1

## 2018-04-22 MED ORDER — K PHOS MONO-SOD PHOS DI & MONO 155-852-130 MG PO TABS
250.0000 mg | ORAL_TABLET | Freq: Two times a day (BID) | ORAL | Status: AC
  Administered 2018-04-22 (×2): 250 mg via ORAL
  Filled 2018-04-22 (×2): qty 1

## 2018-04-22 NOTE — Procedures (Signed)
History: 85 yo F with SDH s/p craniectomy   Sedation: none  Technique: This is a 21 channel routine scalp EEG performed at the bedside with bipolar and monopolar montages arranged in accordance to the international 10/20 system of electrode placement. One channel was dedicated to EKG recording.    Background: There is a posterior dominant rhythm which is better seenon the right than left, though it is seen bilaterally. It achieves a frequency of 7 Hz. There is also irregular generalized slow activity. In addition, there is focal left frontal slowing with occasional left frontal epileptiform discharges. These occur at a frequency of 1 Hz and are most prominent at [facility]. There are also triphasic waves, which though seen with a right frontal predominance, are sometimes left frontally predominant as well.   Photic stimulation: Physiologic driving is not performed  EEG Abnormalities: 1) Occasional Left frontal epileptiform discharges 2) triphasic waves 3) Left frontal slow activity 4) Generalized irregular slow activity 5) breech rhythm  Clinical Interpretation: This EEG is consistent with an epileptogenic area of dysfunction in the left frontal region in the setting of a more generalized non-specific cerebral dysfunction(encephalopathy).  No seizure was seen.  Roland Rack, MD [facility] [phone]  If 7pm- 7am, please page neurology on call as listed in [place].

## 2018-04-22 NOTE — Progress Notes (Signed)
Subjective: Patient reports not following commands  Objective: Vital signs in last 24 hours: Temp:  [97.7 F (36.5 C)-98.5 F (36.9 C)] 98.5 F (36.9 C) (01/04 0800) Pulse Rate:  [78-104] 80 (01/04 0700) Resp:  [19-25] 20 (01/04 0700) BP: (115-161)/(46-84) 122/55 (01/04 0700) SpO2:  [98 %-100 %] 100 % (01/04 0700) Weight:  [73.3 kg] 73.3 kg (01/04 0500)  Intake/Output from previous day: 01/03 0701 - 01/04 0700 In: 3119.5 [I.V.:1704; NG/GT:962.5; IV Piggyback:213] Out: 3007 [Urine:1125; Drains:70] Intake/Output this shift: Total I/O In: 50 [NG/GT:50] Out: 65 [Urine:45; Drains:20]  Physical Exam: Withdraws to pain.  Purposeful movements, not following commands.  Dressing CDI.  Lab Results: Recent Labs    04/22/18 0414  WBC 13.4*  HGB 10.9*  HCT 33.3*  PLT 253   BMET Recent Labs    04/21/18 0344 04/22/18 0414  NA 132* 133*  K 4.5 3.7  CL 99 99  CO2 22 25  GLUCOSE 111* 120*  BUN 19 21  CREATININE 0.74 0.64  CALCIUM 8.6* 8.5*    Studies/Results: Ct Head Wo Contrast  Result Date: 04/21/2018 CLINICAL DATA:  Initial evaluation for change in mental status, altered mental status. EXAM: CT HEAD WITHOUT CONTRAST TECHNIQUE: Contiguous axial images were obtained from the base of the skull through the vertex without intravenous contrast. COMPARISON:  Prior CT from 04/30/2018. FINDINGS: Brain: Postoperative changes from prior left pterional craniotomy again seen. Residual extra-axial hematoma overlying the left cerebral convexity subjacent to the craniotomy bone flap measures up to 6 mm in maximal thickness, similar to previous exam. Superimposed pneumocephalus has resolved. Small volume subarachnoid hemorrhage overlying the left post central gyrus and left temporal occipital region similar to previous. Trace subarachnoid blood noted within the interpeduncular cistern as well. Now evident is small volume intraventricular hemorrhage with small amount of blood layering within the  occipital horns of both lateral ventricles, likely related to redistribution. Stable ventricular size without hydrocephalus or ventricular trapping. Basilar cisterns remain patent. No midline shift. No new intracranial hemorrhage. No acute large vessel territory infarct. Underlying atrophy with mild chronic small vessel ischemic disease again noted. Vascular: No hyperdense vessel. Calcified atherosclerosis at the skull base. Skull: Postoperative changes from prior left craniotomy. Craniotomy bone flap in stable position. Subgaleal drain with overlying skin staples remain in place. Evolving left parieto-occipital scalp contusion noted. Sinuses/Orbits: Globes and orbital soft tissues demonstrate no acute finding. Nasogastric tube partially visualized. Scattered mucosal thickening within the ethmoidal air cells and sphenoid sinuses. Mastoid air cells remain in place. Other: None. IMPRESSION: 1. Similar small residual left extra-axial hematoma measuring up to 6 mm in maximal thickness without significant mass effect. 2. Persistent small volume subarachnoid hemorrhage at the posterior left cerebral hemisphere. New small volume intraventricular hemorrhage compatible with redistribution. No hydrocephalus or ventricular trapping. 3. Postoperative changes from prior left craniotomy for subdural evacuation without adverse features. 4. No other new acute intracranial abnormality. Electronically Signed   By: Jeannine Boga M.D.   On: 04/21/2018 23:06    Assessment/Plan: Decreased LOC.  Repeat head CT shows no reacumulation of SDH.  Agree with repeating EEG and with concern about continued seizures.    LOS: 3 days    Peggyann Shoals, MD 04/22/2018, 10:19 AM

## 2018-04-22 NOTE — Progress Notes (Signed)
SLP Cancellation Note  Patient Details Name: Cassandra Lang MRN: [id number] DOB: 09/30/1924   Cancelled treatment:       Reason Eval/Treat Not Completed: Patient not medically ready Gabriel Rainwater MA, [place] 04/22/2018, 10:24 AM

## 2018-04-22 NOTE — Progress Notes (Addendum)
NEURO HOSPITALIST PROGRESS NOTE   Subjective: Patient in bed, NGT in place. Bilateral wrist. Does not awake to voice. Opens eyes to sternal rub.no seizure like activity noted. Stable mental status.  Exam: Vitals:   04/22/18 0700 04/22/18 0800  BP: (!) 122/55   Pulse: 80   Resp: 20   Temp:  98.5 F (36.9 C)  SpO2: 100%     Physical Exam  HEENT-  Normocephalic, no lesions, without obvious abnormality.  Normal external eye and conjunctiva.   Cardiovascular- S1-S2 audible, pulses palpable throughout   Lungs-no rhonchi or wheezing noted, no excessive working breathing.  Saturations within normal limits on 4 L [place] Abdomen- All 4 quadrants palpated and nontender Extremities- Warm, dry and intact Musculoskeletal-no joint tenderness, deformity or swelling Skin-warm and dry, no hyperpigmentation, vitiligo, or suspicious lesions  Neurological Examination Mental Status: Patient in bed, NGT in place. Bilateral wrist restraints. Does not awake to voice. Opens eyes to sternal rub. Cranial Nerves: II: blinks to threat from left only III,IV, VI: PERRL  Motor/Sensory: Withdraws BLE to noxious.right leg with  Decreased withdrawal when compared to left. BUE attempts made to withdraw to noxious. Left side withdrew more than right. Tone and bulk:normal tone throughout; no atrophy noted; stiff, rigidity noted.  Gait: deferred    Medications:  Scheduled: . sodium chloride   Intravenous Once  . docusate  100 mg Per Tube BID  . heparin injection (subcutaneous)  5,000 Units Subcutaneous Q8H  . hydrochlorothiazide  25 mg Per Tube Daily  . mouth rinse  15 mL Mouth Rinse BID  . metoprolol tartrate  25 mg Per Tube BID  . phosphorus  250 mg Oral BID   Continuous: . sodium chloride 75 mL/hr at 04/22/18 0700  . feeding supplement (VITAL AF 1.2 CAL) 1,000 mL (04/21/18 1409)  . levETIRAcetam Stopped (04/21/18 2132)   HYW:[id number] **OR** acetaminophen,  HYDROcodone-acetaminophen, HYDROmorphone (DILAUDID) injection, labetalol, ondansetron **OR** ondansetron (ZOFRAN) IV, polyethylene glycol, promethazine  Pertinent Labs/Diagnostics: WBC: 13.4 RBC:3.64 Hgb:10.9 HCT: 33.3  Ct Head Wo Contrast  Result Date: 04/21/2018 CLINICAL DATA:  Initial evaluation for change in mental status, altered mental status. EXAM: CT HEAD WITHOUT CONTRAST TECHNIQUE: Contiguous axial images were obtained from the base of the skull through the vertex without intravenous contrast. COMPARISON:  Prior CT from 05/09/2018. FINDINGS: Brain: Postoperative changes from prior left pterional craniotomy again seen. Residual extra-axial hematoma overlying the left cerebral convexity subjacent to the craniotomy bone flap measures up to 6 mm in maximal thickness, similar to previous exam. Superimposed pneumocephalus has resolved. Small volume subarachnoid hemorrhage overlying the left post central gyrus and left temporal occipital region similar to previous. Trace subarachnoid blood noted within the interpeduncular cistern as well. Now evident is small volume intraventricular hemorrhage with small amount of blood layering within the occipital horns of both lateral ventricles, likely related to redistribution. Stable ventricular size without hydrocephalus or ventricular trapping. Basilar cisterns remain patent. No midline shift. No new intracranial hemorrhage. No acute large vessel territory infarct. Underlying atrophy with mild chronic small vessel ischemic disease again noted. Vascular: No hyperdense vessel. Calcified atherosclerosis at the skull base. Skull: Postoperative changes from prior left craniotomy. Craniotomy bone flap in stable position. Subgaleal drain with overlying skin staples remain in place. Evolving left parieto-occipital scalp contusion noted. Sinuses/Orbits: Globes and orbital soft tissues demonstrate no acute finding. Nasogastric tube partially visualized. Scattered mucosal  thickening within the ethmoidal air cells and sphenoid sinuses. Mastoid air cells remain in place. Other: None. IMPRESSION: 1. Similar small residual left extra-axial hematoma measuring up to 6 mm in maximal thickness without significant mass effect. 2. Persistent small volume subarachnoid hemorrhage at the posterior left cerebral hemisphere. New small volume intraventricular hemorrhage compatible with redistribution. No hydrocephalus or ventricular trapping. 3. Postoperative changes from prior left craniotomy for subdural evacuation without adverse features. 4. No other new acute intracranial abnormality. Electronically Signed   By: Jeannine Boga M.D.   On: 04/21/2018 23:06   Assessment:  Continued somnolence in a 85 year old female with acute subdural hematoma, s/p evacuation. 1. Most likely etiology for the patient's somnolence is left cortical dysfunction secondary to adjacent residual hematoma in conjunction with intraventricular blood, which is often associated with somnolence. Cortical irritability and focal slowing of left anterior cerebral hemisphere was noted on LTM EEG.  2. No clinical seizure activity on exam.  3. Also possibly contributing to her AMS would be infection, given elevated WBC on 12/31   Recommendations:  -- continue keppra 500 mg IV BID -- repeat EEG this AM --correct any toxic metabolic derangements   -- We will follow.  Laurey Morale, MSN, NP-C [facility] [phone]  Attending neurologist's note to follow  Attending Neurohospitalist Addendum Patient seen and examined with APP/Resident. Agree with the history and physical as documented above. Agree with the plan as documented, which I helped formulate. I have independently reviewed the chart, obtained history, review of systems and examined the patient.I have personally reviewed pertinent head/neck/spine imaging (CT/MRI). Please feel free to call with any questions. --- Amie Portland,  MD [facility] Pager: [phone]  If 7pm to 7am, please call on call as listed on AMION.    04/22/2018, 8:57 AM

## 2018-04-22 NOTE — Progress Notes (Signed)
S/O: Back from CT, which was obtained for AMS.   Repeat CT head for AMS is stable. Findings as follows: 1. Similar small residual left extra-axial hematoma measuring up to 6 mm in maximal thickness without significant mass effect. 2. Persistent small volume subarachnoid hemorrhage at the posterior left cerebral hemisphere. New small volume intraventricular hemorrhage compatible with redistribution. No hydrocephalus or ventricular trapping. 3. Postoperative changes from prior left craniotomy for subdural evacuation without adverse features. 4. No other new acute intracranial abnormality  LTM EEG yesterday showed focal slowing with breach effect over the left anterior region. Periodic discharges were observed on that side, indicative of underlying cortical irritability. No discrete seizures were observed.  Keppra 1250 was loaded on Friday afternoon, with scheduled dosing of Keppra at 500 mg IV BID started. Repeat EEG was ordered for Saturday AM.    BP (!) 122/46   Pulse 87   Temp 98 F (36.7 C) (Axillary)   Resp (!) 22   Ht 5\' 1"  (1.549 m)   Wt 72.6 kg Comment: per ED RN  SpO2 100%   BMI 30.23 kg/m   Neurological exam: No jerking, twitching or other adventitious movements suggestive of seizures. She will open eyes and moan after prolonged light sternal rub, but does not gaze at examiner and appears to be in a state of semi-arousal. Rapidly falls asleep. Weak withdrawal of LUE and LLE to noxious. Moans to noxious stimulation of RUE and RLE but with no movement.   Assessment: Continued somnolence in a 85 year old female with acute subdural hematoma, s/p evacuation. 1. Most likely etiology for the patient's somnolence is left cortical dysfunction secondary to adjacent residual hematoma in conjunction with intraventricular blood, which is often associated with somnolence. Cortical irritability and focal slowing of left anterior cerebral hemisphere was noted on LTM EEG.  2. No clinical seizure  activity on exam.  3. Also possibly contributing to her AMS would be infection, given elevated WBC on 12/31.  Recommendations: 1. Repeat EEG in AM 2. Continue scheduled Keppra 3. Repeat CBC  35 minutes spent in the emergent neurological evaluation and management of this critically ill patient.   Electronically signed: Dr. Kerney Elbe

## 2018-04-22 NOTE — Progress Notes (Signed)
EEG Completed; Results Pending  

## 2018-04-23 LAB — RENAL FUNCTION PANEL
Albumin: 2.5 g/dL — ABNORMAL LOW (ref 3.5–5.0)
Anion gap: 8 (ref 5–15)
BUN: 20 mg/dL (ref 8–23)
CO2: 29 mmol/L (ref 22–32)
Calcium: 8.5 mg/dL — ABNORMAL LOW (ref 8.9–10.3)
Chloride: 98 mmol/L (ref 98–111)
Creatinine, Ser: 0.55 mg/dL (ref 0.44–1.00)
GFR calc non Af Amer: >60 mL/min (ref >60)
Glucose, Bld: 124 mg/dL — ABNORMAL HIGH (ref 70–99)
Phosphorus: 2 mg/dL — ABNORMAL LOW (ref 2.5–4.6)
Potassium: 3.1 mmol/L — ABNORMAL LOW (ref 3.5–5.1)
Sodium: 135 mmol/L (ref 135–145)

## 2018-04-23 LAB — GLUCOSE, CAPILLARY
Glucose-Capillary: 116 mg/dL — ABNORMAL HIGH (ref 70–99)
Glucose-Capillary: 122 mg/dL — ABNORMAL HIGH (ref 70–99)
Glucose-Capillary: 129 mg/dL — ABNORMAL HIGH (ref 70–99)
Glucose-Capillary: 132 mg/dL — ABNORMAL HIGH (ref 70–99)
Glucose-Capillary: 126 mg/dL — ABNORMAL HIGH (ref 70–99)
Glucose-Capillary: 133 mg/dL — ABNORMAL HIGH (ref 70–99)

## 2018-04-23 LAB — CBC
HCT: 31.9 % — ABNORMAL LOW (ref 36.0–46.0)
Hemoglobin: 10.2 g/dL — ABNORMAL LOW (ref 12.0–15.0)
MCH: 28.7 pg (ref 26.0–34.0)
MCHC: 32 g/dL (ref 30.0–36.0)
MCV: 89.9 fL (ref 80.0–100.0)
Platelets: 259 10*3/uL (ref 150–400)
RBC: 3.55 MIL/uL — ABNORMAL LOW (ref 3.87–5.11)
RDW: 13.2 % (ref 11.5–15.5)
WBC: 10.9 10*3/uL — ABNORMAL HIGH (ref 4.0–10.5)
nRBC: 0 % (ref 0.0–0.2)

## 2018-04-23 LAB — MAGNESIUM: Magnesium: 1.9 mg/dL (ref 1.7–2.4)

## 2018-04-23 MED ORDER — POTASSIUM CHLORIDE 20 MEQ/15ML (10%) PO SOLN
40.0000 meq | Freq: Once | ORAL | Status: AC
  Administered 2018-04-23: 40 meq
  Filled 2018-04-23: qty 30

## 2018-04-23 NOTE — Progress Notes (Addendum)
NEURO HOSPITALIST PROGRESS NOTE   Subjective:  Patient in bed, NGT in place. NAD, Bilateral wrist. Does not awake to voice. Opens eyes to sternal rub.no seizure like activity noted. Stable mental status. Moans more this morning than in previous two days. Per nursing no changes.  Exam: Vitals:   04/23/18 0600 04/23/18 0700  BP: (!) 150/63 (!) 162/65  Pulse: (!) 102 96  Resp: (!) 23 (!) 23  Temp:    SpO2: 99% 99%    Physical Exam  HEENT-  Normocephalic, no lesions, without obvious abnormality.  Normal external eye and conjunctiva.   Cardiovascular- S1-S2 audible, pulses palpable throughout   Lungs-no rhonchi or wheezing noted, no excessive working breathing.  Saturations within normal limits on 4 L [place] Abdomen- All 4 quadrants palpated and nontender Extremities- Warm, dry and intact Musculoskeletal-no joint tenderness, deformity or swelling Skin-warm and dry, no hyperpigmentation, vitiligo, or suspicious lesions  Neurological Examination Mental Status: Patient in bed, NGT in place. Bilateral wrist restraints. Opens eyes to voice.  Does not follow commands. Cranial Nerves: II: blinks to threat from left only III,IV, VI: PERRL  Motor/Sensory: Withdraws LLE to noxious stim briskly .Withdraws right leg mildly to nox stim. Withdraws LUE briskly and attempts to lift, not much movement on RUE.   Gait: deferred   Medications:  Scheduled: . sodium chloride   Intravenous Once  . bethanechol  10 mg Per Tube QID  . docusate  100 mg Per Tube BID  . heparin injection (subcutaneous)  5,000 Units Subcutaneous Q8H  . hydrochlorothiazide  25 mg Per Tube Daily  . mouth rinse  15 mL Mouth Rinse BID  . metoprolol tartrate  25 mg Per Tube BID   Continuous: . sodium chloride 75 mL/hr at 04/23/18 0700  . feeding supplement (VITAL AF 1.2 CAL) 50 mL/hr at 04/23/18 0400  . levETIRAcetam Stopped (04/22/18 2207)   DGU:[id number] **OR** acetaminophen,  HYDROcodone-acetaminophen, HYDROmorphone (DILAUDID) injection, labetalol, ondansetron **OR** ondansetron (ZOFRAN) IV, polyethylene glycol, promethazine  Pertinent Labs/Diagnostics: CBC: 04/23/18 WBC: 10.9 RBC:3.55 Hgb:10.2 HCT: 31.9 * CBC lab values continue to trend down.  Ct Head Wo Contrast  Result Date: 04/21/2018 CLINICAL DATA:  Initial evaluation for change in mental status, altered mental status. EXAM: CT HEAD WITHOUT CONTRAST TECHNIQUE: Contiguous axial images were obtained from the base of the skull through the vertex without intravenous contrast. COMPARISON:  Prior CT from 05/03/2018. FINDINGS: Brain: Postoperative changes from prior left pterional craniotomy again seen. Residual extra-axial hematoma overlying the left cerebral convexity subjacent to the craniotomy bone flap measures up to 6 mm in maximal thickness, similar to previous exam. Superimposed pneumocephalus has resolved. Small volume subarachnoid hemorrhage overlying the left post central gyrus and left temporal occipital region similar to previous. Trace subarachnoid blood noted within the interpeduncular cistern as well. Now evident is small volume intraventricular hemorrhage with small amount of blood layering within the occipital horns of both lateral ventricles, likely related to redistribution. Stable ventricular size without hydrocephalus or ventricular trapping. Basilar cisterns remain patent. No midline shift. No new intracranial hemorrhage. No acute large vessel territory infarct. Underlying atrophy with mild chronic small vessel ischemic disease again noted. Vascular: No hyperdense vessel. Calcified atherosclerosis at the skull base. Skull: Postoperative changes from prior left craniotomy. Craniotomy bone flap in stable position. Subgaleal drain with overlying skin staples remain in place. Evolving left parieto-occipital scalp contusion noted. Sinuses/Orbits: Globes  and orbital soft tissues demonstrate no acute finding.  Nasogastric tube partially visualized. Scattered mucosal thickening within the ethmoidal air cells and sphenoid sinuses. Mastoid air cells remain in place. Other: None. IMPRESSION: 1. Similar small residual left extra-axial hematoma measuring up to 6 mm in maximal thickness without significant mass effect. 2. Persistent small volume subarachnoid hemorrhage at the posterior left cerebral hemisphere. New small volume intraventricular hemorrhage compatible with redistribution. No hydrocephalus or ventricular trapping. 3. Postoperative changes from prior left craniotomy for subdural evacuation without adverse features. 4. No other new acute intracranial abnormality. Electronically Signed   By: Jeannine Boga M.D.   On: 04/21/2018 23:06   04/22/18 rEEG:  Clinical Interpretation: This EEG is consistent with an epileptogenic area of dysfunction in the left frontal region in the setting of a more generalized non-specific cerebral dysfunction(encephalopathy).  No seizure was seen.  Assessment:  Continued somnolence in a 85 year old female with acute subdural hematoma, s/p evacuation. 1. Most likely etiology for the patient's somnolence is left cortical dysfunction secondary to adjacent residual hematoma in conjunction with intraventricular blood, which is often associated with somnolence. Cortical irritability and focal slowing of left anterior cerebral hemisphere was noted on LTM EEG. Repeat spot EEG shows continuing irritability but no seizures. 2. No clinical seizure activity on exam.  3. Also possibly contributing to her AMS would be infection, given elevated WBC on 12/31   Recommendations:  -- continue keppra 500 mg IV BID --correct any toxic metabolic derangements  --Repeat EEG Monday -- We will follow.  Laurey Morale, MSN, NP-C [facility] [phone]  Attending neurologist's note to follow  Attending Neurohospitalist Addendum Patient seen and examined with  APP/Resident. Agree with the history and physical as documented above. Agree with the plan as documented, which I helped formulate. I have independently reviewed the chart, obtained history, review of systems and examined the patient.I have personally reviewed pertinent head/neck/spine imaging (CT/MRI). Please feel free to call with any questions. --- Amie Portland, MD [facility] Pager: [phone]  If 7pm to 7am, please call on call as listed on AMION.  04/23/2018, 7:58 AM

## 2018-04-23 NOTE — Progress Notes (Signed)
SLP Cancellation Note  Patient Details Name: AZZURE GARABEDIAN MRN: [id number] DOB: 1924/08/19   Cancelled treatment:       Reason Eval/Treat Not Completed: Medical issues which prohibited therapy. Pt obtunded. Will follow up.  Deneise Lever, [place], CCC-SLP Speech-Language Pathologist Acute Rehabilitation Services Pager: [phone] Office: [phone]    Aliene Altes 04/23/2018, 12:10 PM

## 2018-04-23 NOTE — Progress Notes (Signed)
Subjective: Patient reports still obtunded.  Objective: Vital signs in last 24 hours: Temp:  [98 F (36.7 C)-98.6 F (37 C)] 98.2 F (36.8 C) (01/05 0400) Pulse Rate:  [75-106] 96 (01/05 0700) Resp:  [18-26] 23 (01/05 0700) BP: (122-168)/(50-77) 162/65 (01/05 0700) SpO2:  [97 %-100 %] 99 % (01/05 0700) Weight:  [73.9 kg] 73.9 kg (01/05 0458)  Intake/Output from previous day: 01/04 0701 - 01/05 0700 In: 2847.8 [I.V.:1747.9; NG/GT:900; IV Piggyback:199.8] Out: 1570 [Urine:1410; Drains:160] Intake/Output this shift: No intake/output data recorded.  Physical Exam: Opens eyes, not following commands.  Withdraws to noxious stimuli.  Lab Results: Recent Labs    04/22/18 0414 04/23/18 0259  WBC 13.4* 10.9*  HGB 10.9* 10.2*  HCT 33.3* 31.9*  PLT 253 259   BMET Recent Labs    04/22/18 0414 04/23/18 0259  NA 133* 135  K 3.7 3.1*  CL 99 98  CO2 25 29  GLUCOSE 120* 124*  BUN 21 20  CREATININE 0.64 0.55  CALCIUM 8.5* 8.5*    Studies/Results: Ct Head Wo Contrast  Result Date: 04/21/2018 CLINICAL DATA:  Initial evaluation for change in mental status, altered mental status. EXAM: CT HEAD WITHOUT CONTRAST TECHNIQUE: Contiguous axial images were obtained from the base of the skull through the vertex without intravenous contrast. COMPARISON:  Prior CT from 04/28/2018. FINDINGS: Brain: Postoperative changes from prior left pterional craniotomy again seen. Residual extra-axial hematoma overlying the left cerebral convexity subjacent to the craniotomy bone flap measures up to 6 mm in maximal thickness, similar to previous exam. Superimposed pneumocephalus has resolved. Small volume subarachnoid hemorrhage overlying the left post central gyrus and left temporal occipital region similar to previous. Trace subarachnoid blood noted within the interpeduncular cistern as well. Now evident is small volume intraventricular hemorrhage with small amount of blood layering within the occipital horns  of both lateral ventricles, likely related to redistribution. Stable ventricular size without hydrocephalus or ventricular trapping. Basilar cisterns remain patent. No midline shift. No new intracranial hemorrhage. No acute large vessel territory infarct. Underlying atrophy with mild chronic small vessel ischemic disease again noted. Vascular: No hyperdense vessel. Calcified atherosclerosis at the skull base. Skull: Postoperative changes from prior left craniotomy. Craniotomy bone flap in stable position. Subgaleal drain with overlying skin staples remain in place. Evolving left parieto-occipital scalp contusion noted. Sinuses/Orbits: Globes and orbital soft tissues demonstrate no acute finding. Nasogastric tube partially visualized. Scattered mucosal thickening within the ethmoidal air cells and sphenoid sinuses. Mastoid air cells remain in place. Other: None. IMPRESSION: 1. Similar small residual left extra-axial hematoma measuring up to 6 mm in maximal thickness without significant mass effect. 2. Persistent small volume subarachnoid hemorrhage at the posterior left cerebral hemisphere. New small volume intraventricular hemorrhage compatible with redistribution. No hydrocephalus or ventricular trapping. 3. Postoperative changes from prior left craniotomy for subdural evacuation without adverse features. 4. No other new acute intracranial abnormality. Electronically Signed   By: Jeannine Boga M.D.   On: 04/21/2018 23:06    Assessment/Plan: Continued epileptiform activity on EEG.  Continue supportive care and appreciate Neurology help.    LOS: 4 days    Peggyann Shoals, MD 04/23/2018, 8:56 AM

## 2018-04-24 ENCOUNTER — Inpatient Hospital Stay (HOSPITAL_COMMUNITY): Payer: Medicare Other

## 2018-04-24 LAB — RENAL FUNCTION PANEL
Albumin: 2.5 g/dL — ABNORMAL LOW (ref 3.5–5.0)
Albumin: 2.5 g/dL — ABNORMAL LOW (ref 3.5–5.0)
Anion gap: 4 — ABNORMAL LOW (ref 5–15)
Anion gap: 7 (ref 5–15)
BUN: 20 mg/dL (ref 8–23)
BUN: 19 mg/dL (ref 8–23)
CO2: 31 mmol/L (ref 22–32)
CO2: 30 mmol/L (ref 22–32)
Calcium: 8.7 mg/dL — ABNORMAL LOW (ref 8.9–10.3)
Calcium: 9.1 mg/dL (ref 8.9–10.3)
Chloride: 100 mmol/L (ref 98–111)
Chloride: 98 mmol/L (ref 98–111)
Creatinine, Ser: 0.55 mg/dL (ref 0.44–1.00)
Creatinine, Ser: 0.53 mg/dL (ref 0.44–1.00)
GFR calc Af Amer: >60 mL/min (ref >60)
GFR calc Af Amer: >60 mL/min (ref >60)
GFR calc non Af Amer: >60 mL/min (ref >60)
GFR calc non Af Amer: >60 mL/min (ref >60)
GLUCOSE: 127 mg/dL — AB (ref 70–99)
Glucose, Bld: 130 mg/dL — ABNORMAL HIGH (ref 70–99)
Phosphorus: 2.6 mg/dL (ref 2.5–4.6)
Phosphorus: 2.5 mg/dL (ref 2.5–4.6)
Potassium: 3.6 mmol/L (ref 3.5–5.1)
Potassium: 3.6 mmol/L (ref 3.5–5.1)
SODIUM: 135 mmol/L (ref 135–145)
Sodium: 135 mmol/L (ref 135–145)

## 2018-04-24 LAB — GLUCOSE, CAPILLARY
GLUCOSE-CAPILLARY: 114 mg/dL — AB (ref 70–99)
Glucose-Capillary: 129 mg/dL — ABNORMAL HIGH (ref 70–99)
Glucose-Capillary: 126 mg/dL — ABNORMAL HIGH (ref 70–99)
Glucose-Capillary: 144 mg/dL — ABNORMAL HIGH (ref 70–99)
Glucose-Capillary: 118 mg/dL — ABNORMAL HIGH (ref 70–99)

## 2018-04-24 LAB — AMMONIA: Ammonia: 56 umol/L — ABNORMAL HIGH (ref 9–35)

## 2018-04-24 NOTE — Progress Notes (Addendum)
Neurosurgery Service Progress Note  Subjective: No acute events overnight  Objective: Vitals:   04/24/18 0500 04/24/18 0600 04/24/18 0647 04/24/18 0700  BP: (!) 128/55 (!) 171/83 (!) 153/76 (!) 170/84  Pulse: 84 (!) 105 95 98  Resp: 20 (!) 28 (!) 26 (!) 25  Temp:      TempSrc:      SpO2: 97% 97% 94% 94%  Weight: 75.3 kg     Height:       Temp (24hrs), Avg:98.6 F (37 C), Min:97.9 F (36.6 C), Max:99.1 F (37.3 C)  CBC Latest Ref Rng & Units 04/23/2018 04/22/2018 05/12/2018  WBC 4.0 - 10.5 K/uL 10.9(H) 13.4(H) -  Hemoglobin 12.0 - 15.0 g/dL 10.2(L) 10.9(L) 14.3  Hematocrit 36.0 - 46.0 % 31.9(L) 33.3(L) 42.0  Platelets 150 - 400 K/uL 259 253 -   BMP Latest Ref Rng & Units 04/23/2018 04/22/2018 04/21/2018  Glucose 70 - 99 mg/dL 124(H) 120(H) 111(H)  BUN 8 - 23 mg/dL 20 21 19   Creatinine 0.44 - 1.00 mg/dL 0.55 0.64 0.74  Sodium 135 - 145 mmol/L 135 133(L) 132(L)  Potassium 3.5 - 5.1 mmol/L 3.1(L) 3.7 4.5  Chloride 98 - 111 mmol/L 98 99 99  CO2 22 - 32 mmol/L 29 25 22   Calcium 8.9 - 10.3 mg/dL 8.5(L) 8.5(L) 8.6(L)    Intake/Output Summary (Last 24 hours) at 04/24/2018 0826 Last data filed at 04/24/2018 0700 Gross per 24 hour  Intake 3009.01 ml  Output 1278 ml  Net 1731.01 ml    Current Facility-Administered Medications:  .  0.9 %  sodium chloride infusion (Manually program via Guardrails IV Fluids), , Intravenous, Once, Amorah Sebring A, MD .  0.9 %  sodium chloride infusion, , Intravenous, Continuous, Bergman, Meghan D, NP, Last Rate: 75 mL/hr at 04/24/18 0700 .  acetaminophen (TYLENOL) tablet 650 mg, 650 mg, Oral, Q4H PRN, 650 mg at 04/23/18 2215 **OR** acetaminophen (TYLENOL) suppository 650 mg, 650 mg, Rectal, Q4H PRN, Judith Part, MD .  bethanechol (URECHOLINE) tablet 10 mg, 10 mg, Per Tube, QID, Erline Levine, MD, 10 mg at 04/23/18 2215 .  docusate (COLACE) 50 MG/5ML liquid 100 mg, 100 mg, Per Tube, BID, Judith Part, MD, 100 mg at 04/23/18 1041 .  feeding  supplement (VITAL AF 1.2 CAL) liquid 1,000 mL, 1,000 mL, Per Tube, Continuous, Eman Rynders, Joyice Faster, MD, Last Rate: 50 mL/hr at 04/24/18 0700 .  heparin injection 5,000 Units, 5,000 Units, Subcutaneous, Q8H, Ranita Stjulien, Joyice Faster, MD, 5,000 Units at 04/24/18 0612 .  hydrochlorothiazide (HYDRODIURIL) tablet 25 mg, 25 mg, Per Tube, Daily, Judith Part, MD, 25 mg at 04/23/18 1042 .  HYDROcodone-acetaminophen (NORCO/VICODIN) 5-325 MG per tablet 1 tablet, 1 tablet, Oral, Q4H PRN, Jakwon Gayton A, MD .  HYDROmorphone (DILAUDID) injection 0.5 mg, 0.5 mg, Intravenous, Q3H PRN, Judith Part, MD .  labetalol (NORMODYNE,TRANDATE) injection 10-40 mg, 10-40 mg, Intravenous, Q10 min PRN, Judith Part, MD, 20 mg at 04/24/18 0612 .  levETIRAcetam (KEPPRA) IVPB 500 mg/100 mL premix, 500 mg, Intravenous, Q12H, Vonzella Nipple, NP, Stopped at 04/23/18 2232 .  MEDLINE mouth rinse, 15 mL, Mouth Rinse, BID, Kentley Blyden, Joyice Faster, MD, 15 mL at 04/23/18 2216 .  metoprolol tartrate (LOPRESSOR) tablet 25 mg, 25 mg, Per Tube, BID, Judith Part, MD, 25 mg at 04/23/18 2215 .  ondansetron (ZOFRAN) tablet 4 mg, 4 mg, Oral, Q4H PRN **OR** ondansetron (ZOFRAN) injection 4 mg, 4 mg, Intravenous, Q4H PRN, Judith Part, MD .  polyethylene  glycol (MIRALAX / GLYCOLAX) packet 17 g, 17 g, Oral, Daily PRN, Elmon Shader A, MD .  promethazine (PHENERGAN) tablet 12.5-25 mg, 12.5-25 mg, Oral, Q4H PRN, Judith Part, MD   Physical Exam: Eyes open to stimulation, not FC, PERRL, gaze neutral, w/d x4 but slower and right, moans to painful stimulus  Assessment & Plan: 85 y.o. woman s/p emergent evacuation of SDH. Neuro: -exam lateralizing more than is typical for TBI w/ large SDH -EEG with some PLEDs previously, on keppra 500bid -repeat EEG today, if negative then MRI to evaluate for other structural cause of L hemispheric dysfunction and confirm that the problem is electrical -restraints as  needed for pt safety, minimize delirium as able -given triphasics on prior EEG, will repeat RFP and NH4  Cardiopulm: No active issues  FENGI: -plan pending diagnosis of cerebral pathology, if electrical then will give a few days to see pace of recovery then discuss PEG w/ family -continue foley catheter  Heme/ID: -holding home anticoagulation for PAF given SDH  Dispo/PPx: -SCDs/TEDs, SQH -PM&R consult once pt can participate better with therapies  Judith Part  04/24/18 8:26 AM

## 2018-04-24 NOTE — Progress Notes (Signed)
EEG Completed; Results Pending  

## 2018-04-24 NOTE — Progress Notes (Addendum)
NEUROLOGY PROGRESS NOTE  Subjective: Patient currently nonverbal.  She is sitting at the edge of the bed with Therapy.  She is not following any commands.  Slight withdraw from pain on the right.  Continues to have nasogastric tube. Exam: Vitals:   04/24/18 0700 04/24/18 0800  BP: (!) 170/84 (!) 154/67  Pulse: 98 90  Resp: (!) 25 (!) 26  Temp:  98.1 F (36.7 C)  SpO2: 94% 93%    Physical Exam   HEENT-  Normocephalic, no lesions, without obvious abnormality.  Normal external eye and conjunctiva.   Extremities- Warm, dry and intact Musculoskeletal-no joint tenderness, deformity or swelling Skin-warm and dry, no hyperpigmentation, vitiligo, or suspicious lesions    Neuro:  Mental Status: Nonverbal, appears to be short of breath, drowsy, staring forward.  Minimal response to noxious stimuli on the right leg. Cranial Nerves: II: Blinks to threat on the left III,IV, VI: ptosis not present, doll's intact bilaterally, pupils equal, round, reactive to light and accommodation V,VII: Right facial facial droop VIII: No significant response to verbal stimuli XII: midline tongue extension Motor: Moving left arm and left leg spontaneously in fact she is holding her weight up with her left arm.  Right arm and right leg appears hemiplegic Sensory: As noted above minimal response to noxious stimuli on the right thigh Deep Tendon Reflexes: 2+ and symmetric throughout   Medications:  Scheduled: . sodium chloride   Intravenous Once  . bethanechol  10 mg Per Tube QID  . docusate  100 mg Per Tube BID  . heparin injection (subcutaneous)  5,000 Units Subcutaneous Q8H  . hydrochlorothiazide  25 mg Per Tube Daily  . mouth rinse  15 mL Mouth Rinse BID  . metoprolol tartrate  25 mg Per Tube BID  Keppra 500 mg twice daily IV  Pertinent Labs/Diagnostics: 04/22/18 rEEG:  Clinical Interpretation: ThisEEG is consistent with an epileptogenic area of dysfunction in the left frontal region in the  setting of a moregeneralized non-specific cerebral dysfunction(encephalopathy).   Etta Quill PA-C [facility] [phone]   Assessment: Patient continues to be somnolent status post evacuation of left subdural hematoma. -Most likely etiology of the patient's somnolence is left cortical dysfunction secondary to residual hematoma in conjunction with intraventricular blood.   Repeat  EEG today shows rare right frontal sharps, are much improved in frequency compared to the EEG of 04/22/2018.   Recommendations: -Continue Keppra 500 mg twice daily -Continue seizure precautions -Continue to treat metabolic abnormalities such as hypokalemia -- PT/OT when stable     04/24/2018, 9:27 AM  NEUROHOSPITALIST ADDENDUM Performed a face to face diagnostic evaluation.   I have reviewed the contents of history and physical exam as documented by PA/ARNP/Resident and agree with above documentation.  I have discussed and formulated the above plan as documented. Edits to the note have been made as needed.  Patient today appears to be opening eyes, however not following any commands.  Moves left side spontaneously.  No forced gaze/nystagmus.  Spot EEG shows no evidence of seizures, rare sharps in right frontal region with improved frequency and left hemispheric slowing.   New Keppra 500 mg twice a day.  We will continue to follow   Karena Addison Maizie Garno MD [facility] [phone]   If 7pm to 7am, please call on call as listed on AMION.

## 2018-04-24 NOTE — Progress Notes (Signed)
Physical Therapy Treatment Patient Details Name: Cassandra Lang MRN: [id number] DOB: [birth year] Today's Date: 04/24/2018    History of Present Illness 85 yo female s/p crani due to L SDH (presumed fall, found down by EMS) intubated and extubated 04/21/2018. PMH: Afib arthritis, breast CA '00 mastectomy and chem/radiation, HTN, and PAC,     PT Comments    Pt remains obtunded with no command follow or active participation in therapy. Pt with movement of L UE to face toward NG tube but did not move it to command. Pt did also use it to support self in sitting but continues to be dependent for all mobility. Pt with no command follow or tracking. Pt with labored breathing despite SpO2 >92%. HR 115-120bpm t/o session. Pt not progressing towards goals due to lethargy and regression cognitively. Acute PT to cont to follow. Discussed consulting palliative care with RN for discussion of goals of care/quality of life.    Follow Up Recommendations  SNF     Equipment Recommendations  Wheelchair cushion (measurements PT);Wheelchair (measurements PT);Hospital bed;Other (comment)    Recommendations for Other Services       Precautions / Restrictions Precautions Precautions: Fall Restrictions Weight Bearing Restrictions: No    Mobility  Bed Mobility Overal bed mobility: Needs Assistance Bed Mobility: Supine to Sit;Sit to Supine Rolling: Total assist;+2 for physical assistance Sidelying to sit: Total assist;+2 for physical assistance Supine to sit: Total assist;+2 for physical assistance Sit to supine: Total assist;+2 for physical assistance   General bed mobility comments: pt with no initiation of task, pt rigid and stiff   Transfers                 General transfer comment: in appropriate today  Ambulation/Gait             General Gait Details: unable this date   Stairs             Wheelchair Mobility    Modified Rankin (Stroke Patients Only) Modified Rankin (Stroke  Patients Only) Pre-Morbid Rankin Score: No significant disability Modified Rankin: Severe disability     Balance Overall balance assessment: Needs assistance Sitting-balance support: Feet supported Sitting balance-Leahy Scale: Poor Sitting balance - Comments: dependent on external support to maintain sitting balance, pt actively trying to hold self up with L UE, unable to use R UE functionally to support self Postural control: Posterior lean                                  Cognition Arousal/Alertness: Lethargic Behavior During Therapy: Flat affect Overall Cognitive Status: Impaired/Different from baseline Area of Impairment: Orientation;Attention;Memory;Following commands;Awareness;Safety/judgement;Problem solving;Rancho level               Rancho Levels of Cognitive Functioning [place] [employer] Scales of Cognitive Functioning: Generalized response Orientation Level: Disoriented to;Place;Time;Situation Current Attention Level: Focused Memory: Decreased short-term memory Following Commands: Follows one step commands with increased time;Follows one step commands inconsistently Safety/Judgement: Decreased awareness of safety;Decreased awareness of deficits(L mit on)   Problem Solving: Slow processing;Decreased initiation;Difficulty sequencing;Requires verbal cues;Requires tactile cues General Comments: pt with labored breathing, SPO2> 90%, pt only with protective eye response on the L, pain withdrawl with R LE only, turned to PT when states pt's name x1 trial and to hand clapping to L of L ear x 1 trial      Exercises      General Comments General comments (skin  integrity, edema, etc.): crani incision, SpO2 >90%, HR 115-120bpm      Pertinent Vitals/Pain Pain Assessment: Faces Faces Pain Scale: Hurts a little bit(pt in general with discomfort facial grimace) Pain Intervention(s): Monitored during session    Home Living                      Prior  Function            PT Goals (current goals can now be found in the care plan section) Progress towards PT goals: Not progressing toward goals - comment    Frequency    Min 3X/week      PT Plan Discharge plan needs to be updated    Co-evaluation              AM-PAC PT "6 Clicks" Mobility   Outcome Measure  Help needed turning from your back to your side while in a flat bed without using bedrails?: Total Help needed moving from lying on your back to sitting on the side of a flat bed without using bedrails?: Total Help needed moving to and from a bed to a chair (including a wheelchair)?: Total Help needed standing up from a chair using your arms (e.g., wheelchair or bedside chair)?: Total Help needed to walk in hospital room?: Total Help needed climbing 3-5 steps with a railing? : Total 6 Click Score: 6    End of Session Equipment Utilized During Treatment: Oxygen Activity Tolerance: Patient limited by lethargy Patient left: in bed;with call bell/phone within reach;with nursing/sitter in room;with family/visitor present Nurse Communication: Mobility status PT Visit Diagnosis: Unsteadiness on feet (R26.81);Difficulty in walking, not elsewhere classified (R26.2)     Time: 0912-0928 PT Time Calculation (min) (ACUTE ONLY): 16 min  Charges:  $Therapeutic Activity: 8-22 mins                     Kittie Plater, PT, DPT Acute Rehabilitation Services Pager #: [phone] Office #: [phone]    Berline Lopes 04/24/2018, 11:28 AM

## 2018-04-24 NOTE — Procedures (Signed)
ELECTROENCEPHALOGRAM REPORT   Patient: Cassandra Lang       Room #: 4N24C EEG No. ID: [id number] Age: 85 y.o.        Sex: female Referring Physician: Ostergard Report Date:  04/24/2018        Interpreting Physician: Alexis Goodell  History: Cassandra Lang is an 85 y.o. female s/p fall and SDH  Medications:  Urecholine, Colace, HCTZ, Keppra, Lopressor  Conditions of Recording:  This is a 21 channel routine scalp EEG performed with bipolar and monopolar montages arranged in accordance to the international 10/20 system of electrode placement. One channel was dedicated to EKG recording.  The patient is in the awake and unccoperative state.  Description:  The patient is uncooperative for a good portion of the recording and often caused artifact due to interference with the leads and movement.  When able to be visualized there does appear to be some hemispheric asymmetry with background rhythm noted to be lower over the left hemisphere and at times slower.  Over the right hemisphere the background rhythm is noted to reach a 8 Hz alpha although poorly sustained due to cooperation.   The patient does not drowse or sleep. There is a rare right frontal sharp transient noted.   Hyperventilation and intermittent photic stimulation were not performed.   IMPRESSION: This is an abnormal electroencephalogram due to left hemispheric slowing and decreased amplitude consistent with SDH.  Also noted is rare right frontal sharp transients.  Improved significantly in frequency from those noted on electroencephalogram of 04/22/18.   Alexis Goodell, MD Neurology [phone] 04/24/2018, 1:48 PM

## 2018-04-25 ENCOUNTER — Inpatient Hospital Stay (HOSPITAL_COMMUNITY): Payer: Medicare Other

## 2018-04-25 LAB — GLUCOSE, CAPILLARY
GLUCOSE-CAPILLARY: 104 mg/dL — AB (ref 70–99)
GLUCOSE-CAPILLARY: 128 mg/dL — AB (ref 70–99)
GLUCOSE-CAPILLARY: 132 mg/dL — AB (ref 70–99)
Glucose-Capillary: 112 mg/dL — ABNORMAL HIGH (ref 70–99)
Glucose-Capillary: 119 mg/dL — ABNORMAL HIGH (ref 70–99)
Glucose-Capillary: 120 mg/dL — ABNORMAL HIGH (ref 70–99)
Glucose-Capillary: 132 mg/dL — ABNORMAL HIGH (ref 70–99)

## 2018-04-25 LAB — RENAL FUNCTION PANEL
Albumin: 2.4 g/dL — ABNORMAL LOW (ref 3.5–5.0)
Anion gap: 8 (ref 5–15)
BUN: 18 mg/dL (ref 8–23)
CO2: 33 mmol/L — ABNORMAL HIGH (ref 22–32)
Calcium: 9 mg/dL (ref 8.9–10.3)
Chloride: 95 mmol/L — ABNORMAL LOW (ref 98–111)
Creatinine, Ser: 0.52 mg/dL (ref 0.44–1.00)
GFR calc Af Amer: >60 mL/min (ref >60)
GFR calc non Af Amer: >60 mL/min (ref >60)
Glucose, Bld: 121 mg/dL — ABNORMAL HIGH (ref 70–99)
Phosphorus: 3.4 mg/dL (ref 2.5–4.6)
Potassium: 3.7 mmol/L (ref 3.5–5.1)
Sodium: 136 mmol/L (ref 135–145)

## 2018-04-25 MED ORDER — GERHARDT'S BUTT CREAM
TOPICAL_CREAM | CUTANEOUS | Status: DC | PRN
  Administered 2018-04-26: 1 via TOPICAL
  Filled 2018-04-25: qty 1

## 2018-04-25 NOTE — Progress Notes (Signed)
Patients daughter Loletha Carrow  is bedside. Nursing staff has had conversations with Daughter about her mothers former independence. Daughter reports that she has had conversations with her mother in the past about end of life care and states that she does not want to place a peg tube. Daughter is awaiting Attending Physicians expertise relating to her mothers current condition and likely prognosis.

## 2018-04-25 NOTE — Progress Notes (Signed)
SLP Cancellation Note  Patient Details Name: Cassandra Lang MRN: [id number] DOB: 1925/01/08   Cancelled treatment:       Reason Eval/Treat Not Completed: Medical issues which prohibited therapy, not appropriate for ST services yet   Emilio Math 04/25/2018, 4:21 PM

## 2018-04-25 NOTE — Progress Notes (Addendum)
NEUROLOGY PROGRESS NOTE  Subjective: Patient is comfortable laying in the bed.  No active seizure clinically noted.  Heart rate atrial fibrillation with rapid response but nonverbal.  Currently reacting to noxious stimuli on the left.  Exam: Vitals:   04/25/18 0600 04/25/18 0800  BP: 133/61   Pulse: 87   Resp: 17   Temp:  (!) 97.2 F (36.2 C)  SpO2: 97%     Physical Exam   HEENT-  Normocephalic, no lesions, without obvious abnormality.  Normal external eye and conjunctiva.   Extremities- Warm, dry and intact Musculoskeletal-no joint tenderness, deformity or swelling Skin-warm and dry, no hyperpigmentation, vitiligo, or suspicious lesions    Neuro:  Mental Status: Patient is awake.  Breathing on her own.  Continues to have nasogastric tube.  Does localize to sternal rub with her left hand.  No withdrawal from noxious stimuli on the right side.  Does not follow commands.  Remains nonverbal Cranial Nerves: II: Blinks to threat bilaterally III,IV, VI: ptosis not present, doll's intact.  Bilaterally pupils equal, round, reactive to light and accommodation V,VII: Right facial droop. VIII: Opens eyes to voice IX,X: uvula rises midline XII: midline tongue at rest Motor: To sternal rub she does localize with her left hand is able to move antigravity.  Patient does not move either legs to noxious stimuli.  Right arm does not move to noxious stimuli and appears flaccid. Sensory: Winces to noxious stimuli on the left and right leg but not right arm Deep Tendon Reflexes: 2+ and symmetric throughout Plantars: Upgoing bilaterally     Medications:  Scheduled: . sodium chloride   Intravenous Once  . bethanechol  10 mg Per Tube QID  . docusate  100 mg Per Tube BID  . heparin injection (subcutaneous)  5,000 Units Subcutaneous Q8H  . hydrochlorothiazide  25 mg Per Tube Daily  . mouth rinse  15 mL Mouth Rinse BID  . metoprolol tartrate  25 mg Per Tube BID   Continuous: . sodium  chloride 75 mL/hr at 04/25/18 0600  . feeding supplement (VITAL AF 1.2 CAL) 50 mL/hr at 04/25/18 0600  . levETIRAcetam 500 mg (04/25/18 0935)   ASN:[id number] **OR** acetaminophen, HYDROcodone-acetaminophen, HYDROmorphone (DILAUDID) injection, labetalol, ondansetron **OR** ondansetron (ZOFRAN) IV, polyethylene glycol, promethazine  Pertinent Labs/Diagnostics: As noted yesterday repeat EEG showed rare right frontal sharps, which have improved since 04/22/2018  No results found.   Etta Quill PA-C [facility] [phone]   Assessment: Patient continues to be somnolent status post evacuation of left subdural hematoma. -Most likely etiology of this somnolence is left cortical dysfunction secondary to residual hematoma in conjunction with intraventricular blood.  As noted repeat EEG showed rare right frontal sharps which is an improvement since EEG of 04/22/2018 Recommendations: -Continue Keppra 500 mg twice daily -Continue seizure precautions -Continue to treat any abnormal metabolic abnormalities if present  -At this time neurology will sign off.    04/25/2018, 9:39 AM   NEUROHOSPITALIST ADDENDUM Performed a face to face diagnostic evaluation.   I have reviewed the contents of history and physical exam as documented by PA/ARNP/Resident and agree with above documentation.  I have discussed and formulated the above plan as documented. Edits to the note have been made as needed.  Patient has not had further clinical seizure activity. Repeat EEGs have shown no seizures.  Mental status likely from left sided SDH. If patient does not improve clinically consider palliative consult.     Karena Addison Olivine Hiers MD [facility] [phone]   If  7pm to 7am, please call on call as listed on AMION.

## 2018-04-25 NOTE — Progress Notes (Signed)
Neurosurgery Service Progress Note  Subjective: No acute events overnight  Objective: Vitals:   04/25/18 0800 04/25/18 1000 04/25/18 1100 04/25/18 1200  BP: (!) 166/72 127/63 128/60   Pulse: (!) 106 83 67   Resp: (!) 22 15 15    Temp: (!) 97.2 F (36.2 C)   (!) 97.2 F (36.2 C)  TempSrc: Axillary   Axillary  SpO2: 90% 96% 96%   Weight:      Height:       Temp (24hrs), Avg:97.7 F (36.5 C), Min:97.2 F (36.2 C), Max:98.3 F (36.8 C)  CBC Latest Ref Rng & Units 04/23/2018 04/22/2018 05/12/2018  WBC 4.0 - 10.5 K/uL 10.9(H) 13.4(H) -  Hemoglobin 12.0 - 15.0 g/dL 10.2(L) 10.9(L) 14.3  Hematocrit 36.0 - 46.0 % 31.9(L) 33.3(L) 42.0  Platelets 150 - 400 K/uL 259 253 -   BMP Latest Ref Rng & Units 04/25/2018 04/24/2018 04/24/2018  Glucose 70 - 99 mg/dL 121(H) 130(H) 127(H)  BUN 8 - 23 mg/dL 18 19 20   Creatinine 0.44 - 1.00 mg/dL 0.52 0.53 0.55  Sodium 135 - 145 mmol/L 136 135 135  Potassium 3.5 - 5.1 mmol/L 3.7 3.6 3.6  Chloride 98 - 111 mmol/L 95(L) 98 100  CO2 22 - 32 mmol/L 33(H) 30 31  Calcium 8.9 - 10.3 mg/dL 9.0 9.1 8.7(L)    Intake/Output Summary (Last 24 hours) at 04/25/2018 1449 Last data filed at 04/25/2018 0800 Gross per 24 hour  Intake 2114.57 ml  Output 750 ml  Net 1364.57 ml    Current Facility-Administered Medications:  .  0.9 %  sodium chloride infusion (Manually program via Guardrails IV Fluids), , Intravenous, Once, Glanda Spanbauer A, MD .  0.9 %  sodium chloride infusion, , Intravenous, Continuous, Bergman, Meghan D, NP, Last Rate: 75 mL/hr at 04/25/18 1320 .  acetaminophen (TYLENOL) tablet 650 mg, 650 mg, Oral, Q4H PRN, 650 mg at 04/23/18 2215 **OR** acetaminophen (TYLENOL) suppository 650 mg, 650 mg, Rectal, Q4H PRN, Judith Part, MD .  bethanechol (URECHOLINE) tablet 10 mg, 10 mg, Per Tube, QID, Erline Levine, MD, 10 mg at 04/25/18 1418 .  docusate (COLACE) 50 MG/5ML liquid 100 mg, 100 mg, Per Tube, BID, Judith Part, MD, 100 mg at 04/23/18 1041 .   feeding supplement (VITAL AF 1.2 CAL) liquid 1,000 mL, 1,000 mL, Per Tube, Continuous, Naif Alabi, Joyice Faster, MD, Last Rate: 50 mL/hr at 04/25/18 0600 .  heparin injection 5,000 Units, 5,000 Units, Subcutaneous, Q8H, Delia Sitar, Joyice Faster, MD, 5,000 Units at 04/25/18 1418 .  hydrochlorothiazide (HYDRODIURIL) tablet 25 mg, 25 mg, Per Tube, Daily, Judith Part, MD, 25 mg at 04/25/18 0937 .  HYDROcodone-acetaminophen (NORCO/VICODIN) 5-325 MG per tablet 1 tablet, 1 tablet, Oral, Q4H PRN, Judith Part, MD, 1 tablet at 04/25/18 1418 .  HYDROmorphone (DILAUDID) injection 0.5 mg, 0.5 mg, Intravenous, Q3H PRN, Judith Part, MD, 0.5 mg at 04/25/18 0759 .  labetalol (NORMODYNE,TRANDATE) injection 10-40 mg, 10-40 mg, Intravenous, Q10 min PRN, Judith Part, MD, 20 mg at 04/24/18 0612 .  levETIRAcetam (KEPPRA) IVPB 500 mg/100 mL premix, 500 mg, Intravenous, Q12H, Vonzella Nipple, NP, Last Rate: 400 mL/hr at 04/25/18 0935, 500 mg at 04/25/18 0935 .  MEDLINE mouth rinse, 15 mL, Mouth Rinse, BID, Anara Cowman, Joyice Faster, MD, 15 mL at 04/25/18 0939 .  metoprolol tartrate (LOPRESSOR) tablet 25 mg, 25 mg, Per Tube, BID, Judith Part, MD, 25 mg at 04/25/18 0937 .  ondansetron (ZOFRAN) tablet 4 mg, 4 mg, Oral,  Q4H PRN **OR** ondansetron (ZOFRAN) injection 4 mg, 4 mg, Intravenous, Q4H PRN, Judith Part, MD, 4 mg at 04/24/18 1017 .  polyethylene glycol (MIRALAX / GLYCOLAX) packet 17 g, 17 g, Oral, Daily PRN, Jamesen Stahnke A, MD .  promethazine (PHENERGAN) tablet 12.5-25 mg, 12.5-25 mg, Oral, Q4H PRN, Judith Part, MD   Physical Exam: Eyes open to stimulation, not FC, PERRL, gaze neutral, w/d x4 but slower and right, moans to painful stimulus  Assessment & Plan: 85 y.o. woman s/p emergent evacuation of SDH. Neuro: -MRI to confirm absence of anatomic problem, would expect electrical problem to either progress or improve by now -EEG with some PLEDs previously, on keppra  500bid  Cardiopulm: No active issues  FENGI: -plan pending diagnosis of cerebral pathology, if electrical then will give a few days to see pace of recovery then discuss PEG w/ family -continue foley catheter  Heme/ID: -holding home anticoagulation for PAF given SDH  Dispo/PPx: -SCDs/TEDs, SQH -PM&R consult once pt can participate better with therapies  Judith Part  04/25/18 2:49 PM

## 2018-04-25 NOTE — Progress Notes (Signed)
OT Cancellation Note  Patient Details Name: Cassandra Lang MRN: [id number] DOB: Oct 10, 1924   Cancelled Treatment:    Reason Eval/Treat Not Completed: Other (comment), per RN patient remains lethargic and unable to follow commands, discussed consult for palliative care and RN reports awaiting MD to provide prognosis.  Will follow, and continue OT as appropriate.   Delight Stare, OT Acute Rehabilitation Services Pager [phone] Office [phone]    Delight Stare 04/25/2018, 2:20 PM

## 2018-04-26 LAB — RENAL FUNCTION PANEL
Albumin: 2.4 g/dL — ABNORMAL LOW (ref 3.5–5.0)
Anion gap: 7 (ref 5–15)
BUN: 23 mg/dL (ref 8–23)
CO2: 35 mmol/L — ABNORMAL HIGH (ref 22–32)
Calcium: 9.3 mg/dL (ref 8.9–10.3)
Chloride: 95 mmol/L — ABNORMAL LOW (ref 98–111)
Creatinine, Ser: 0.54 mg/dL (ref 0.44–1.00)
GFR calc Af Amer: >60 mL/min (ref >60)
Glucose, Bld: 117 mg/dL — ABNORMAL HIGH (ref 70–99)
Phosphorus: 3.6 mg/dL (ref 2.5–4.6)
Potassium: 4 mmol/L (ref 3.5–5.1)
Sodium: 137 mmol/L (ref 135–145)

## 2018-04-26 LAB — GLUCOSE, CAPILLARY
Glucose-Capillary: 112 mg/dL — ABNORMAL HIGH (ref 70–99)
Glucose-Capillary: 127 mg/dL — ABNORMAL HIGH (ref 70–99)

## 2018-04-26 MED ORDER — HYDROMORPHONE BOLUS VIA INFUSION
0.5000 mg | INTRAVENOUS | Status: DC | PRN
  Filled 2018-04-26: qty 1

## 2018-04-26 MED ORDER — GLYCOPYRROLATE 0.2 MG/ML IJ SOLN
0.1000 mg | INTRAMUSCULAR | Status: DC
  Administered 2018-04-26 – 2018-04-27 (×4): 0.1 mg via INTRAVENOUS
  Filled 2018-04-26 (×4): qty 1

## 2018-04-26 MED ORDER — SODIUM CHLORIDE 0.9 % IV SOLN
0.5000 mg/h | INTRAVENOUS | Status: DC
  Administered 2018-04-26: 0.5 mg/h via INTRAVENOUS
  Filled 2018-04-26: qty 5

## 2018-04-26 MED ORDER — LORAZEPAM 1 MG PO TABS: 1.0000 mg | ORAL_TABLET | ORAL | Status: DC | PRN

## 2018-04-26 MED ORDER — BIOTENE DRY MOUTH MT LIQD: 15.0000 mL | OROMUCOSAL | Status: DC | PRN

## 2018-04-26 MED ORDER — METOPROLOL TARTRATE 5 MG/5ML IV SOLN
5.0000 mg | INTRAVENOUS | Status: DC | PRN
  Administered 2018-04-26: 5 mg via INTRAVENOUS
  Filled 2018-04-26: qty 5

## 2018-04-26 MED ORDER — LORAZEPAM 2 MG/ML PO CONC
1.0000 mg | ORAL | Status: DC | PRN
  Filled 2018-04-26: qty 0.5

## 2018-04-26 MED ORDER — LORAZEPAM 2 MG/ML IJ SOLN: 1.0000 mg | INTRAMUSCULAR | Status: DC | PRN

## 2018-04-26 MED ORDER — POLYVINYL ALCOHOL 1.4 % OP SOLN
1.0000 [drp] | Freq: Four times a day (QID) | OPHTHALMIC | Status: DC | PRN
  Filled 2018-04-26: qty 15

## 2018-04-26 MED ORDER — GLYCOPYRROLATE 0.2 MG/ML IJ SOLN
0.1000 mg | Freq: Once | INTRAMUSCULAR | Status: AC
  Administered 2018-04-26: 0.1 mg via INTRAVENOUS
  Filled 2018-04-26: qty 1

## 2018-04-26 NOTE — Progress Notes (Signed)
   04/26/18 1334  Clinical Encounter Type  Visited With Patient;Other (Comment) (Friend)  Visit Type Initial;Spiritual support;Other (Comment) (Transition to [place])  Referral From Palliative care team  Consult/Referral To Chaplain  The chaplain responded to PMT referral for spiritual care.  The chaplain checked in with RN before entering Pt. room.  The Pt. friend-Rachel was present with the Pt.  The Pt. daughter stepped away with her husband.  The pastoral care consisted of story sharing and prayer.  Apolonio Schneiders shared the Pt. religious connection with a [employer] in [place]. Apolonio Schneiders declined refreshment, stating she will step away when the Pt. Daughter returns.  The chaplain invited a F/U spiritual care visit when the daughter returns.

## 2018-04-26 NOTE — Progress Notes (Addendum)
Neurosurgery update  Discussed w/ pt's bedside RN and daughter. Her upper respiratory control is worsening with increased secretions. I discussed w/ the patient's daughter, who confirmed the DNR and we agreed on a plan to transition her to palliative treatment. Pt's daughter is en route to the hospital right now. I informed her that her respiratory status is worsening and that, without intubation, it would likely not be long before she dies.   -tachycardic and hypertensive, likely 2/2 respiratory cause, will give glycopyrrolate to help w/ upper respiratory secretions once HR improved, can give metop q14min until HR<100 before giving the glyco -okay for CPAP/BiPAP until patient's daughter arrives, do not intubate, suction prn -palliative care consult

## 2018-04-26 NOTE — Consult Note (Signed)
Palliative Care Consult Note  Patient seen and evaluated and I have met with her daughter Loletha Carrow to confirm goals of care. Ms. Petrides has declined rapidly over the past 24 hours and has been transitioned to comfort care. Palliative consult obtained for assistance with terminal care, support and symptom management.  1. DNR 2. Transition to Full Comfort- complete order reconsiliation done to reflect this transition. 3. Remove NRB and any unnecessary monitoring 4. Initiate a hydromorphone infusion at 0.'5mg'$ /hr and 0.'5mg'$  bolus for dyspnea and pain 5. Continue Keppra infusion with IV ativan for any breakthrough seizure activity at EOL. 6. Secretion are significant- will start robinul IV scheduled q4 hours, ok to NT suction if needed. 7. For now I anticipate she will die within the next few hours-would not move her to another unit in this current state of instability- will reassess if needed. 8. Will provide grief and spiritual support to family- especially Vickie who is an only child.  Lane Hacker, DO Palliative Medicine [phone]  Time:50 min Greater than 50%  of this time was spent counseling and coordinating care related to the above assessment and plan.

## 2018-04-26 NOTE — Progress Notes (Signed)
PT Cancellation Note  Patient Details Name: Cassandra Lang MRN: [id number] DOB: Aug 24, 1924   Cancelled Treatment:    Reason Eval/Treat Not Completed: Patient not medically ready. Pt with medical decline and poor prognosis. Pt now under palliative care treatment. Cancelling PT consult as she no longer has acute PT needs. Please re-consult if needed in future.  Kittie Plater, PT, DPT Acute Rehabilitation Services Pager #: [phone] Office #: [phone]    Berline Lopes 04/26/2018, 10:38 AM

## 2018-04-26 NOTE — Progress Notes (Signed)
Neurosurgery Service Progress Note  Subjective: No acute events overnight  Objective: Vitals:   04/26/18 0400 04/26/18 0500 04/26/18 0600 04/26/18 0700  BP: (!) 150/88 (!) 159/71 (!) 134/53   Pulse: 84 95 88 99  Resp: 18 17 14  (!) 21  Temp: 97.7 F (36.5 C)     TempSrc: Axillary     SpO2: 97% 97% 96% (!) 88%  Weight:  75.4 kg    Height:       Temp (24hrs), Avg:97.5 F (36.4 C), Min:97.2 F (36.2 C), Max:97.9 F (36.6 C)  CBC Latest Ref Rng & Units 04/23/2018 04/22/2018 04/21/2018  WBC 4.0 - 10.5 K/uL 10.9(H) 13.4(H) -  Hemoglobin 12.0 - 15.0 g/dL 10.2(L) 10.9(L) 14.3  Hematocrit 36.0 - 46.0 % 31.9(L) 33.3(L) 42.0  Platelets 150 - 400 K/uL 259 253 -   BMP Latest Ref Rng & Units 04/26/2018 04/25/2018 04/24/2018  Glucose 70 - 99 mg/dL 117(H) 121(H) 130(H)  BUN 8 - 23 mg/dL 23 18 19   Creatinine 0.44 - 1.00 mg/dL 0.54 0.52 0.53  Sodium 135 - 145 mmol/L 137 136 135  Potassium 3.5 - 5.1 mmol/L 4.0 3.7 3.6  Chloride 98 - 111 mmol/L 95(L) 95(L) 98  CO2 22 - 32 mmol/L 35(H) 33(H) 30  Calcium 8.9 - 10.3 mg/dL 9.3 9.0 9.1    Intake/Output Summary (Last 24 hours) at 04/26/2018 0710 Last data filed at 04/26/2018 0700 Gross per 24 hour  Intake 2996.13 ml  Output 250 ml  Net 2746.13 ml    Current Facility-Administered Medications:  .  0.9 %  sodium chloride infusion (Manually program via Guardrails IV Fluids), , Intravenous, Once, Elijiah Mickley A, MD .  0.9 %  sodium chloride infusion, , Intravenous, Continuous, Bergman, Meghan D, NP, Last Rate: 75 mL/hr at 04/26/18 0700 .  acetaminophen (TYLENOL) tablet 650 mg, 650 mg, Oral, Q4H PRN, 650 mg at 04/23/18 2215 **OR** acetaminophen (TYLENOL) suppository 650 mg, 650 mg, Rectal, Q4H PRN, Judith Part, MD .  bethanechol (URECHOLINE) tablet 10 mg, 10 mg, Per Tube, QID, Erline Levine, MD, 10 mg at 04/25/18 2132 .  docusate (COLACE) 50 MG/5ML liquid 100 mg, 100 mg, Per Tube, BID, Judith Part, MD, 100 mg at 04/23/18 1041 .  feeding  supplement (VITAL AF 1.2 CAL) liquid 1,000 mL, 1,000 mL, Per Tube, Continuous, Tresia Revolorio, Joyice Faster, MD, Last Rate: 50 mL/hr at 04/26/18 0700 .  Gerhardt's butt cream, , Topical, PRN, Judith Part, MD, 1 application at 60/63/01 0007 .  heparin injection 5,000 Units, 5,000 Units, Subcutaneous, Q8H, Asharia Lotter, Joyice Faster, MD, 5,000 Units at 04/26/18 0530 .  hydrochlorothiazide (HYDRODIURIL) tablet 25 mg, 25 mg, Per Tube, Daily, Judith Part, MD, 25 mg at 04/25/18 0937 .  HYDROcodone-acetaminophen (NORCO/VICODIN) 5-325 MG per tablet 1 tablet, 1 tablet, Oral, Q4H PRN, Judith Part, MD, 1 tablet at 04/26/18 0356 .  HYDROmorphone (DILAUDID) injection 0.5 mg, 0.5 mg, Intravenous, Q3H PRN, Judith Part, MD, 0.5 mg at 04/26/18 0530 .  labetalol (NORMODYNE,TRANDATE) injection 10-40 mg, 10-40 mg, Intravenous, Q10 min PRN, Judith Part, MD, 20 mg at 04/24/18 0612 .  levETIRAcetam (KEPPRA) IVPB 500 mg/100 mL premix, 500 mg, Intravenous, Q12H, Vonzella Nipple, NP, Stopped at 04/25/18 2149 .  MEDLINE mouth rinse, 15 mL, Mouth Rinse, BID, Judith Part, MD, 15 mL at 04/25/18 2133 .  metoprolol tartrate (LOPRESSOR) tablet 25 mg, 25 mg, Per Tube, BID, Judith Part, MD, 25 mg at 04/25/18 2132 .  ondansetron (  ZOFRAN) tablet 4 mg, 4 mg, Oral, Q4H PRN **OR** ondansetron (ZOFRAN) injection 4 mg, 4 mg, Intravenous, Q4H PRN, Judith Part, MD, 4 mg at 04/24/18 1017 .  polyethylene glycol (MIRALAX / GLYCOLAX) packet 17 g, 17 g, Oral, Daily PRN, Obinna Ehresman, Joyice Faster, MD .  promethazine (PHENERGAN) tablet 12.5-25 mg, 12.5-25 mg, Oral, Q4H PRN, Judith Part, MD   Physical Exam: Eyes open to stimulation, not FC, PERRL, gaze neutral, w/d x4 but slower and right, moans to painful stimulus, some upper respiratory secretions requiring suction  Assessment & Plan: 85 y.o. woman s/p emergent evacuation of SDH. Neuro: -MRI shows no evidence of ischemia, minimal residual  SDH -EEG with some PLEDs previously, on keppra 500bid  Cardiopulm: No active issues  FENGI: -cont dobhoff -continue foley catheter  Heme/ID: -holding home anticoagulation for PAF given SDH  Dispo/PPx: -SCDs/TEDs, SQH -will discuss with family regarding prognosis. She does not have any structural evidence of brain injury, but she will likely require a few weeks of difficult recovery and my understand is that she does not want a PEG. While I think she has a chance of recovery, she is at high risk of perioperative complications given her age. I will discuss with the family later today, pt is currently DNR status.  Joyice Faster Erdem Naas  04/26/18 7:10 AM

## 2018-04-26 NOTE — Progress Notes (Signed)
OT Cancellation Note  Patient Details Name: Cassandra Lang MRN: [id number] DOB: 07-May-1924   Cancelled Treatment:    Reason Eval/Treat Not Completed: Other (comment): Patient with medical decline and poor prognosis, pt now under palliative care treatment.  OT signing off, no further acute OT needs at this time.  Please re-consult in future if needed.   Delight Stare, OT Acute Rehabilitation Services Pager [phone] Office [phone]  Delight Stare 04/26/2018, 10:55 AM

## 2018-05-17 ENCOUNTER — Encounter

## 2018-05-17 ENCOUNTER — Ambulatory Visit: Payer: Medicare Other | Admitting: Cardiology

## 2018-05-20 NOTE — Progress Notes (Signed)
Pt expired at 31, and death pronounced by 2Rns. Vickie Hester(daughter) and Glenford Peers, NP was made aware of pt's death. [facility] was called for this referral. Dilaudid 0.5mg /ml, 31ml was wasted with Lupe Carney, Rn.

## 2018-05-20 NOTE — Discharge Summary (Signed)
Discharge Summary  Date of Admission: 03/19/2018  Date of Discharge: May 13, 2018  Attending Physician: Emelda Brothers, MD  Hospital Course: Patient presented to the Emergency Department after being found down at home. A CT head showed a very large acute left subdural hematoma with herniation. Her anticoagulant was reversed and she was emergently taken to the OR for evacuation. Post-operatively, a CT head showed excellent evacuation and she was extubated. However, she continued to have symptoms of left hemispheric dysfunction. An EEG showed some occasional left frontal hemispheric spikes, but long term EEG showed no seizure activity. A trial of keppra did not improve the electrographic or clinical findings. She was very somnolent, so further AEDs, at her age, would likely precipitate intubation and she was DNI/DNR. Encephalopathy workup did not reveal a reversible cause. While waiting for an improvement in her hemispheric dysfunction, she became increasingly somnolent with poor upper respiratory control. Given her DNR/DNI status, this was discussed with the family and she was transitioned to comfort care on 2018/05/12. The patient died on 05-13-2018 at 01:42 and family was informed.  Judith Part, MD 05/13/18 9:13 AM

## 2018-05-20 DEATH — deceased

## 2019-07-18 IMAGING — RF DG SWALLOWING FUNCTION
10 series · 24 of 24 positions shown · non-contrast
Comparison: Esophagram 09/06/2016

CLINICAL DATA: Aspiration on recent esophagram

EXAM:
MODIFIED BARIUM SWALLOW
TECHNIQUE: Different consistencies of barium were administered orally to the
patient by the Speech Pathologist. Imaging of the pharynx was
performed in the lateral projection.
FLUOROSCOPY TIME:  Fluoroscopy Time:  1.3 minutes
Radiation Exposure Index (if provided by the fluoroscopic device): 3
mGy
Number of Acquired Spot Images: 0

[Series 1: cp_standard · 0.34mm/px · 3 of 100 frames shown (1 of 10)]
[frame 16/100]
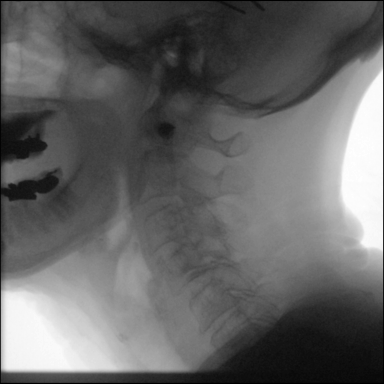
[frame 69/100]
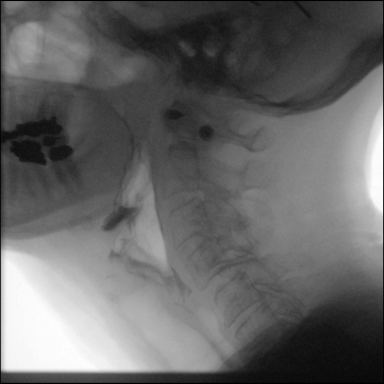
[frame 86/100]
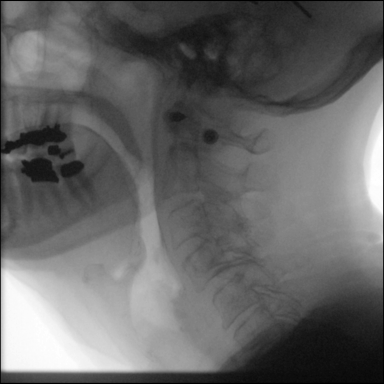

[Series 2: cp_standard · 0.34mm/px · 2 of 109 frames shown (2 of 10)]
[frame 33/109]
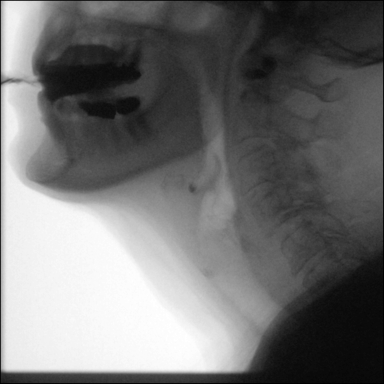
[frame 93/109]
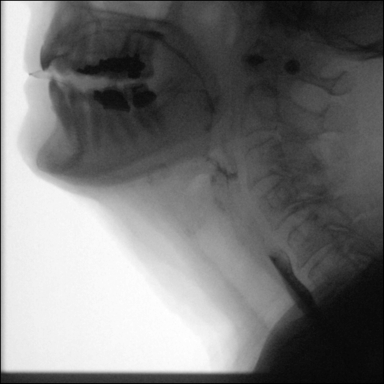

[Series 3: cp_standard · 0.34mm/px · 2 of 103 frames shown (3 of 10)]
[frame 16/103]
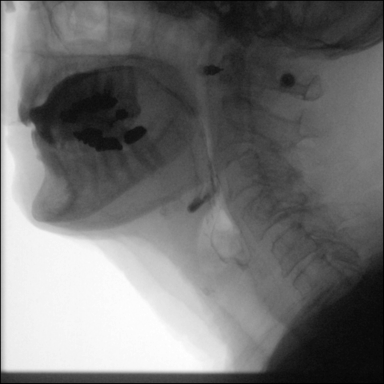
[frame 88/103]
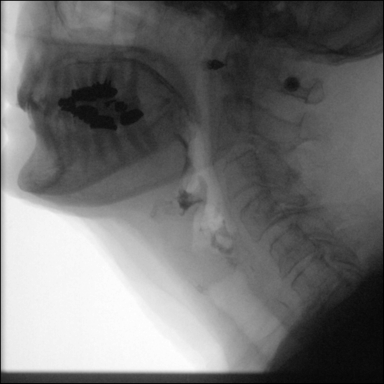

[Series 4: cp_standard · 0.34mm/px · 3 of 71 frames shown (4 of 10)]
[frame 11/71]
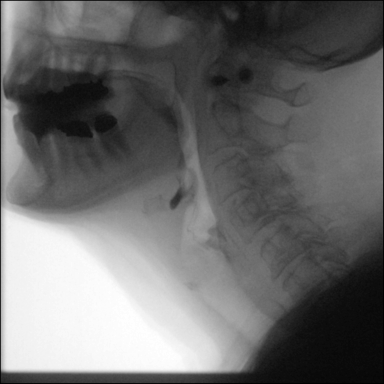
[frame 41/71]
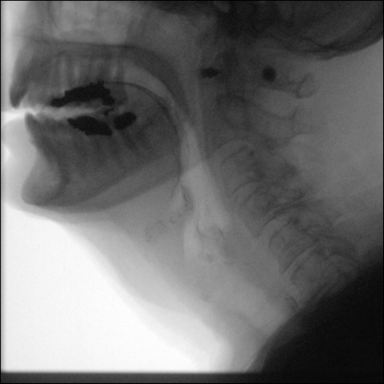
[frame 61/71]
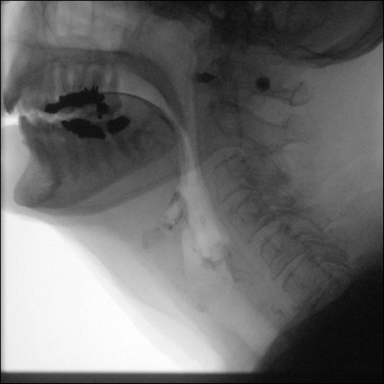

[Series 5: cp_standard · 0.34mm/px · 2 of 21 frames shown (5 of 10)]
[frame 11/21]
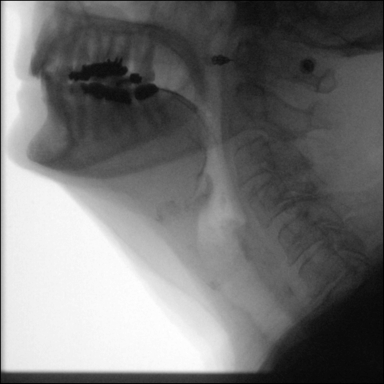
[frame 19/21]
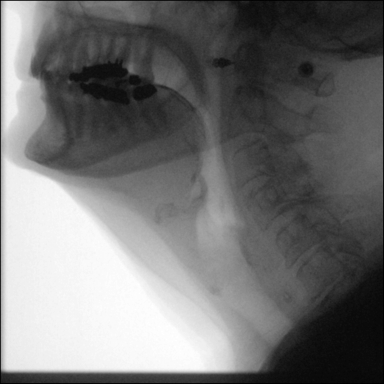

[Series 6: cp_standard · 0.34mm/px · 2 of 18 frames shown (6 of 10)]
[frame 1/18]
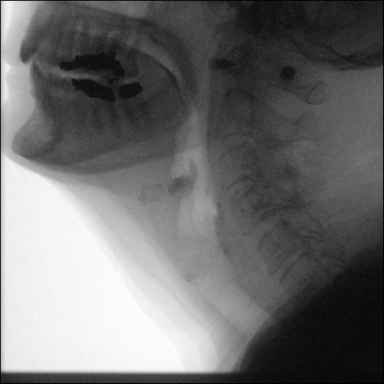
[frame 10/18]
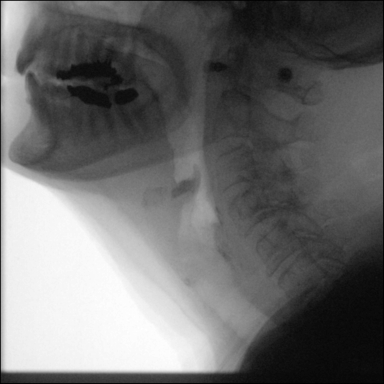

[Series 7: cp_standard · 0.34mm/px · 3 of 35 frames shown (7 of 10)]
[frame 6/35]
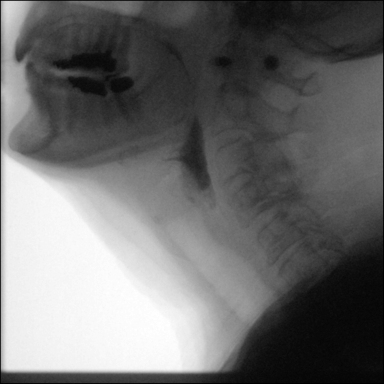
[frame 18/35]
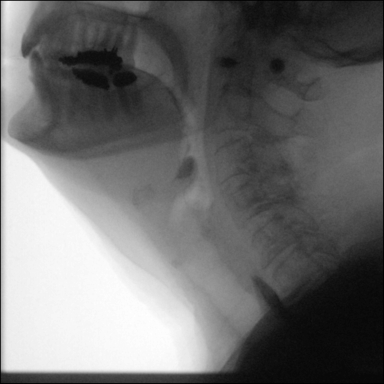
[frame 31/35]
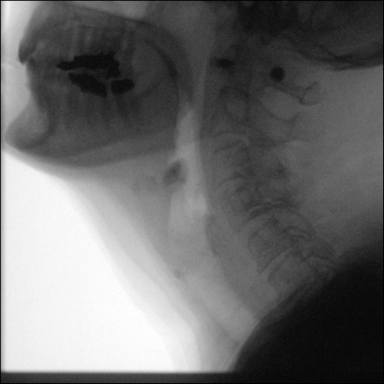

[Series 8: cp_standard · 0.34mm/px · 2 of 125 frames shown (8 of 10)]
[frame 63/125]
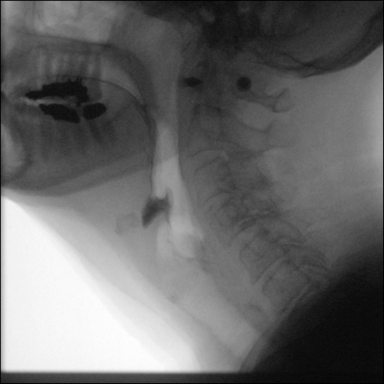
[frame 122/125]
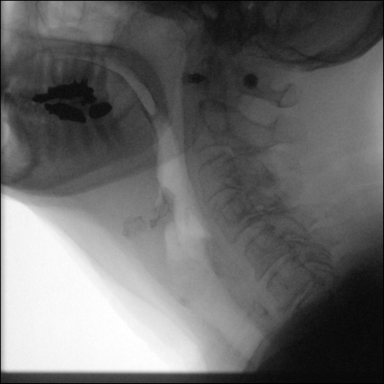

[Series 9: cp_standard · 0.34mm/px · 2 of 86 frames shown (9 of 10)]
[frame 13/86]
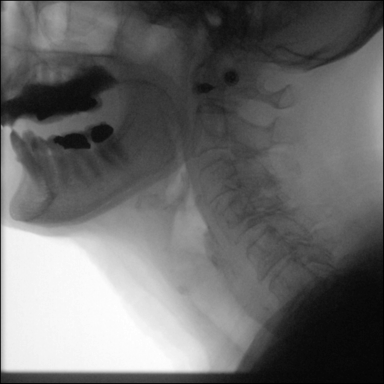
[frame 74/86]
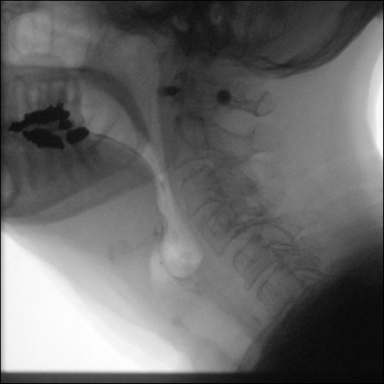

[Series 10: cp_standard · 0.34mm/px · 3 of 55 frames shown (10 of 10)]
[frame 9/55]
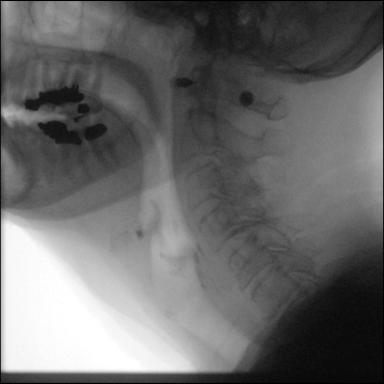
[frame 28/55]
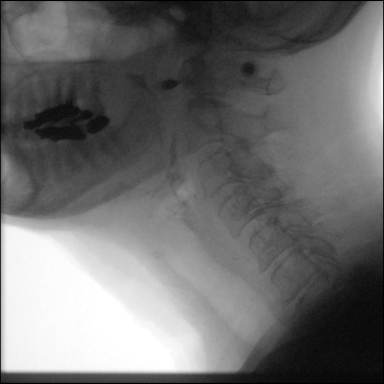
[frame 48/55]
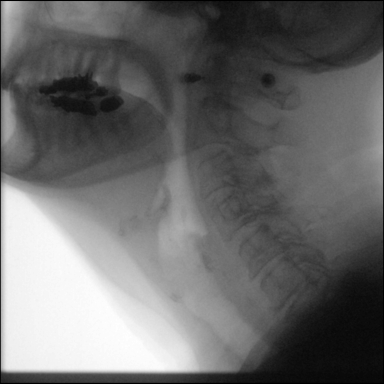

[24 of 24 positions shown; findings below may reference images not displayed]

FINDINGS: Barium meal ranging from thin to solid was provided to the patient.
There was good oral handling. With thin consistency laryngeal
penetration was noted with small volume reaching the cords.
Occasional mild stasis seen in the vallecula, mainly with the
thicker consistencies. No aspiration seen. No noted obstructive
process.
IMPRESSION: 1. Aspiration was not re- demonstrated. Small volume laryngeal
penetration was occasionally seen with thin consistency.
2. Please refer to the Speech Pathologists report for complete
details and recommendations.
